# Patient Record
Sex: Female | Born: 1965
Health system: Southern US, Community
[De-identification: ages and names within clinical notes are randomized; demographics above are authoritative.]

## PROBLEM LIST (undated history)

## (undated) DIAGNOSIS — R7303 Prediabetes: Secondary | ICD-10-CM

## (undated) DIAGNOSIS — K219 Gastro-esophageal reflux disease without esophagitis: Secondary | ICD-10-CM

## (undated) DIAGNOSIS — Z8489 Family history of other specified conditions: Secondary | ICD-10-CM

## (undated) DIAGNOSIS — I10 Essential (primary) hypertension: Secondary | ICD-10-CM

## (undated) HISTORY — PX: CHOLECYSTECTOMY: SHX55

## (undated) HISTORY — PX: OVARIAN CYST REMOVAL: SHX89

## (undated) HISTORY — DX: Prediabetes: R73.03

---

## 1996-05-13 HISTORY — PX: CHOLECYSTECTOMY: SHX55

## 2000-10-24 ENCOUNTER — Other Ambulatory Visit: Admission: RE | Admit: 2000-10-24 | Discharge: 2000-10-24 | Payer: Self-pay | Admitting: Internal Medicine

## 2005-07-24 ENCOUNTER — Emergency Department (HOSPITAL_COMMUNITY): Admission: EM | Admit: 2005-07-24 | Discharge: 2005-07-24 | Payer: Self-pay | Admitting: Emergency Medicine

## 2011-01-11 ENCOUNTER — Emergency Department (HOSPITAL_COMMUNITY)
Admission: EM | Admit: 2011-01-11 | Discharge: 2011-01-11 | Disposition: A | Discharge status: Home / Self Care | Payer: No Typology Code available for payment source | Attending: Emergency Medicine | Admitting: Emergency Medicine

## 2011-01-11 ENCOUNTER — Encounter: Payer: Self-pay | Admitting: Emergency Medicine

## 2011-01-11 ENCOUNTER — Emergency Department (HOSPITAL_COMMUNITY): Payer: No Typology Code available for payment source

## 2011-01-11 DIAGNOSIS — Y92410 Unspecified street and highway as the place of occurrence of the external cause: Secondary | ICD-10-CM | POA: Insufficient documentation

## 2011-01-11 DIAGNOSIS — IMO0002 Reserved for concepts with insufficient information to code with codable children: Secondary | ICD-10-CM

## 2011-01-11 MED ORDER — HYDROCODONE-ACETAMINOPHEN 5-325 MG PO TABS: ORAL_TABLET | ORAL | Status: DC

## 2011-01-11 MED ORDER — METHOCARBAMOL 500 MG PO TABS: ORAL_TABLET | ORAL | Status: DC
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
## 2011-01-12 NOTE — ED Provider Notes (Signed)
Medical screening examination/treatment/procedure(s) were performed by non-physician practitioner and as supervising physician I was immediately available for consultation/collaboration.   Benny Lennert, MD 01/12/11 1051

## 2011-01-14 ENCOUNTER — Encounter (HOSPITAL_COMMUNITY): Payer: Self-pay | Admitting: *Deleted

## 2017-09-16 ENCOUNTER — Other Ambulatory Visit (HOSPITAL_COMMUNITY): Payer: Self-pay | Admitting: Family Medicine

## 2017-09-16 DIAGNOSIS — Z1231 Encounter for screening mammogram for malignant neoplasm of breast: Principal | ICD-10-CM

## 2017-09-17 ENCOUNTER — Other Ambulatory Visit (HOSPITAL_COMMUNITY): Payer: Self-pay | Admitting: Family Medicine

## 2017-09-17 DIAGNOSIS — Z1231 Encounter for screening mammogram for malignant neoplasm of breast: Secondary | ICD-10-CM

## 2017-09-19 ENCOUNTER — Encounter: Payer: Self-pay | Admitting: Gastroenterology

## 2017-09-22 ENCOUNTER — Ambulatory Visit (HOSPITAL_COMMUNITY)
Admission: RE | Admit: 2017-09-22 | Discharge: 2017-09-22 | Disposition: A | Discharge status: Home / Self Care | Payer: BLUE CROSS/BLUE SHIELD | Source: Ambulatory Visit | Attending: Family Medicine | Admitting: Family Medicine

## 2017-09-22 ENCOUNTER — Encounter (HOSPITAL_COMMUNITY): Payer: Self-pay | Admitting: Radiology

## 2017-09-22 DIAGNOSIS — Z1231 Encounter for screening mammogram for malignant neoplasm of breast: Secondary | ICD-10-CM | POA: Insufficient documentation

## 2017-10-23 ENCOUNTER — Ambulatory Visit (INDEPENDENT_AMBULATORY_CARE_PROVIDER_SITE_OTHER): Payer: Self-pay

## 2017-10-23 DIAGNOSIS — Z1211 Encounter for screening for malignant neoplasm of colon: Secondary | ICD-10-CM

## 2017-10-23 MED ORDER — NA SULFATE-K SULFATE-MG SULF 17.5-3.13-1.6 GM/177ML PO SOLN: 1.0000 | ORAL | 0 refills | Status: DC

## 2017-10-23 NOTE — Progress Notes (Signed)
Gastroenterology Pre-Procedure Review  Request Date:10/23/17 Requesting Physician: Dr.[facility] (no previous tcs)  PATIENT REVIEW QUESTIONS: The patient responded to the following health history questions as indicated:    Pt stated she had her gallbladder removed 20 years ago and certain foods she eats will cause diarrhea but this only happens with certain foods.   1. Diabetes Melitis: no 2. Joint replacements in the past 12 months: no 3. Major health problems in the past 3 months: no 4. Has an artificial valve or MVP: no 5. Has a defibrillator: no 6. Has been advised in past to take antibiotics in advance of a procedure like teeth cleaning: no 7. Family history of colon cancer: no  8. Alcohol Use: no 9. History of sleep apnea: no  10. History of coronary artery or other vascular stents placed within the last 12 months: no 11. History of any prior anesthesia complications: no    MEDICATIONS & ALLERGIES:    Patient reports the following regarding taking any blood thinners:   Plavix? no Aspirin? no Coumadin? no Brilinta? no Xarelto? no Eliquis? no Pradaxa? no Savaysa? no Effient? no  Patient confirms/reports the following medications:  Current Outpatient Medications  Medication Sig Dispense Refill  . amLODipine (NORVASC) 5 MG tablet daily.  1  . esomeprazole (NEXIUM) 40 MG capsule Take 40 mg by mouth daily at 12 noon.    . hydrochlorothiazide (HYDRODIURIL) 12.5 MG tablet daily.  2   No current facility-administered medications for this visit.     Patient confirms/reports the following allergies:  Allergies  Allergen Reactions  . Codeine Nausea And Vomiting    Abdominal Cramping    No orders of the defined types were placed in this encounter.   AUTHORIZATION INFORMATION Primary Insurance: BCBS  ID #: [id number] Pre-Cert / Josem Kaufmann required: no   SCHEDULE INFORMATION: Procedure has been scheduled as follows:  Date: 12/19/17, Time:  7:30 Location: [facility] Dr.Rourk  This Gastroenterology Pre-Precedure Review Form is being routed to the following provider(s): Walden Field NP

## 2017-10-23 NOTE — Progress Notes (Signed)
Ok to schedule.

## 2017-10-23 NOTE — Patient Instructions (Addendum)
Carrie Evans  1965/09/25 MRN: [id number]     Procedure Date: 11/17/2018 Time to register: 11:15am Place to register: [facility] Procedure Time: 12:15pm Scheduled provider: R. Garfield Cornea, MD      PREPARATION FOR COLONOSCOPY WITH SUPREP BOWEL PREP KIT  Note: Suprep Bowel Prep Kit is a split-dose (2day) regimen. Consumption of BOTH 6-ounce bottles is required for a complete prep.  Please notify us immediately if you are diabetic, take iron supplements, or if you are on Coumadin or any other blood thinners or if any of your health information changes prior to your colonoscopy.                                                                                                                                            2 DAYS BEFORE PROCEDURE:  DATE: 11/15/2018  DAY: Sunday Begin clear liquid diet AFTER your lunch meal. NO SOLID FOODS after this point.  1 DAY BEFORE PROCEDURE:  DATE: 11/16/2018 DAY: Monday Continue clear liquids the entire day - NO SOLID FOOD.   At 6:00pm: Complete steps 1 through 4 below, using ONE (1) 6-ounce bottle, before going to bed. Step 1:  Pour ONE (1) 6-ounce bottle of SUPREP liquid into the mixing container.  Step 2:  Add cool drinking water to the 16 ounce line on the container and mix.  Note: Dilute the solution concentrate as directed prior to use. Step 3:  DRINK ALL the liquid in the container. Step 4:  You MUST drink an additional two (2) or more 16 ounce containers of water over the next one (1) hour.   Continue clear liquids.  DAY OF PROCEDURE:   DATE: 11/17/2018  DAY: Tuesday If you take medications for your heart, blood pressure, or breathing, you may take these medications.   5 hours before your procedure at :7:15am Step 1:  Pour ONE (1) 6-ounce bottle of SUPREP liquid into the mixing container.  Step 2:  Add cool drinking water to the 16 ounce line on the container and mix.  Note: Dilute the solution concentrate as directed prior to  use. Step 3:  DRINK ALL the liquid in the container. Step 4:  You MUST drink an additional two (2) or more 16 ounce containers of water over the next one (1) hour. You MUST complete the final glass of water at least 3 hours before your colonoscopy.   Nothing by mouth past:9:15am  You may take your morning medications with sip of water unless we have instructed otherwise.    Please see below for Dietary Information.  CLEAR LIQUIDS INCLUDE:  Water Jello (NOT red in color)   Ice Popsicles (NOT red in color)   Tea (sugar ok, no milk/cream) Powdered fruit flavored drinks  Coffee (sugar ok, no milk/cream) Gatorade/ Lemonade/ Kool-Aid  (NOT red in color)   Juice: apple, white grape, white cranberry  Soft drinks  Clear bullion, consomme, broth (fat free beef/chicken/vegetable)  Carbonated beverages (any kind)  Strained chicken noodle soup Hard Candy   Remember: Clear liquids are liquids that will allow you to see your fingers on the other side of a clear glass. Be sure liquids are NOT red in color, and not cloudy, but CLEAR.  DO NOT EAT OR DRINK ANY OF THE FOLLOWING:  Dairy products of any kind   Cranberry juice Tomato juice / V8 juice   Grapefruit juice Orange juice     Red grape juice  Do not eat any solid foods, including such foods as: cereal, oatmeal, yogurt, fruits, vegetables, creamed soups, eggs, bread, crackers, pureed foods in a blender, etc.   HELPFUL HINTS FOR DRINKING PREP SOLUTION:   Make sure prep is extremely cold. Mix and refrigerate the the morning of the prep. You may also put in the freezer.   You may try mixing some Crystal Light or Country Time Lemonade if you prefer. Mix in small amounts; add more if necessary.  Try drinking through a straw  Rinse mouth with water or a mouthwash between glasses, to remove after-taste.  Try sipping on a cold beverage /ice/ popsicles between glasses of prep.  Place a piece of sugar-free hard candy in mouth between glasses.  If  you become nauseated, try consuming smaller amounts, or stretch out the time between glasses. Stop for 30-60 minutes, then slowly start back drinking.     OTHER INSTRUCTIONS  You will need a responsible adult at least 51 years of age to accompany you and drive you home. This person must remain in the waiting room during your procedure. The hospital will cancel your procedure if you do not have a responsible adult with you.   1. Wear loose fitting clothing that is easily removed. 2. Leave jewelry and other valuables at home.  3. Remove all body piercing jewelry and leave at home. 4. Total time from sign-in until discharge is approximately 2-3 hours. 5. You should go home directly after your procedure and rest. You can resume normal activities the day after your procedure. 6. The day of your procedure you should not:  Drive  Make legal decisions  Operate machinery  Drink alcohol  Return to work   You may call the office (Dept: [phone]) before 5:00pm, or page the doctor on call [phone]) after 5:00pm, for further instructions, if necessary.   Insurance Information YOU WILL NEED TO CHECK WITH YOUR INSURANCE COMPANY FOR THE BENEFITS OF COVERAGE YOU HAVE FOR THIS PROCEDURE.  UNFORTUNATELY, NOT ALL INSURANCE COMPANIES HAVE BENEFITS TO COVER ALL OR PART OF THESE TYPES OF PROCEDURES.  IT IS YOUR RESPONSIBILITY TO CHECK YOUR BENEFITS, HOWEVER, WE WILL BE GLAD TO ASSIST YOU WITH ANY CODES YOUR INSURANCE COMPANY MAY NEED.    PLEASE NOTE THAT MOST INSURANCE COMPANIES WILL NOT COVER A SCREENING COLONOSCOPY FOR PEOPLE UNDER THE AGE OF 50  IF YOU HAVE BCBS INSURANCE, YOU MAY HAVE BENEFITS FOR A SCREENING COLONOSCOPY BUT IF POLYPS ARE FOUND THE DIAGNOSIS WILL CHANGE AND THEN YOU MAY HAVE A DEDUCTIBLE THAT WILL NEED TO BE MET. SO PLEASE MAKE SURE YOU CHECK YOUR BENEFITS FOR A SCREENING COLONOSCOPY AS WELL AS A DIAGNOSTIC COLONOSCOPY.

## 2017-10-28 ENCOUNTER — Telehealth: Payer: Self-pay | Admitting: Internal Medicine

## 2017-10-28 NOTE — Telephone Encounter (Signed)
[phone] patient called and said the copay for her prep was very high and she was told we could call her another one into her pharmacy

## 2017-10-29 MED ORDER — PEG 3350-KCL-NA BICARB-NACL 420 G PO SOLR: 4000.0000 mL | ORAL | 0 refills | Status: DC

## 2017-10-29 NOTE — Telephone Encounter (Signed)
Sent in new rx for trilyte and new instructions have been mailed to the pt. Tried to call to inform pt, NA and no voicemail. Please let her know if she calls back.

## 2017-10-29 NOTE — Progress Notes (Signed)
See phone note about prep, copay too high, sent in rx for trilyte and new instructions mailed to the pt.

## 2017-10-29 NOTE — Addendum Note (Signed)
Addended by: Claudina Lick on: 10/29/2017 01:38 PM   Modules accepted: Orders

## 2017-10-29 NOTE — Telephone Encounter (Signed)
Patient made aware.

## 2017-12-08 ENCOUNTER — Telehealth: Payer: Self-pay | Admitting: Internal Medicine

## 2017-12-08 NOTE — Progress Notes (Signed)
Pt called and changed date of procedure. New instructions have been mailed to the pt with the correct dates. See phone note.

## 2017-12-08 NOTE — Telephone Encounter (Signed)
Carrie Evans, SHE WANTS TO RESCHEDULE HER PROCEDURE TO THE NEXT AVAILABLE Friday

## 2017-12-08 NOTE — Telephone Encounter (Signed)
Called pt, changed date to 01/09/18. New instructions mailed to the pt with correct dates. Called [place] and LM to change dates of procedure.

## 2017-12-09 NOTE — Telephone Encounter (Signed)
Hoyle Sauer called- the spot on 01/09/18 was not available, called and informed pt, moved to 01/30/18 and instructions were mailed to the pt. Was able to retreive previous instructions before they went out in the mail.

## 2018-01-22 ENCOUNTER — Telehealth: Payer: Self-pay | Admitting: Internal Medicine

## 2018-01-22 NOTE — Telephone Encounter (Signed)
Pt wants to reschedule her colonoscopy that's scheduled with [facility] on 9/20. Please call her at [phone]

## 2018-01-22 NOTE — Telephone Encounter (Signed)
Tried to call pt- NA-no voicemail.  

## 2018-01-26 NOTE — Telephone Encounter (Signed)
Called [facility] and LM for them to do the suprep and if there were any problems to call me. Pt stated she would pay the $90 copay for it.

## 2018-01-26 NOTE — Telephone Encounter (Signed)
Called pt- moved her to 03/13/18, I will mail new instructions to her and she does want the suprep and has asked me to send that in for her again. I called and LM for Hoyle Sauer and I have moved her on our schedule.

## 2018-01-27 NOTE — Telephone Encounter (Signed)
New instructions done and mailed to the pt.

## 2018-01-27 NOTE — Progress Notes (Signed)
Pt called- changed date of tcs and prep. See phone note. New instructions mailed to the pt.

## 2018-03-11 ENCOUNTER — Telehealth: Payer: Self-pay

## 2018-03-11 NOTE — Telephone Encounter (Signed)
Called pt, explained to her that if we reschedule her, it will be a January appt. Pt stated she understood. She does not have anyone who can bring her on 03/13/18. I gave her dates in January and she is going to check with family members to see if someone can bring her. Pt will call me back this week. I have taken her off the schedule and called endo and asked them to take her off of Friday and I would call back as soon as the pt calls me back for them to put her back on the schedule.

## 2018-03-11 NOTE — Telephone Encounter (Signed)
[phone] please call patient, she needs to reschedule her tcs

## 2018-03-16 ENCOUNTER — Telehealth: Payer: Self-pay | Admitting: Gastroenterology

## 2018-03-16 NOTE — Telephone Encounter (Signed)
[phone] PLEASE CALL PATIENT, SHE NEEDS TO RESCHEDULE HER TCS

## 2018-03-16 NOTE — Progress Notes (Signed)
See phone note, pt changed date of procedure. New instructions done and mailed to the pt.

## 2018-03-16 NOTE — Telephone Encounter (Signed)
Scheduled pt, mailed new instructions, called and lm for [facility].

## 2018-03-17 NOTE — Progress Notes (Signed)
Carrie Evans called- the date for this tcs is an 8:30 start day- I have gotten the pts instructions from the mail and have changed the times on these and mailed them to the pt.

## 2018-03-25 NOTE — Progress Notes (Signed)
Due to a change in [facility] schedule, we had to reschedule the pt, she called back and said she checked with work and could not come on 06/10/18. We have rescheduled her to 06/24/18 and mailed new instructions. Endo is aware.

## 2018-06-23 ENCOUNTER — Telehealth: Payer: Self-pay | Admitting: Internal Medicine

## 2018-06-23 NOTE — Telephone Encounter (Signed)
[phone] PATIENT NEEDS TO RESCHEDULE PROCEDURE SHE IS SCHEDULED FOR Wednesday.  NO TRANSPORTATION

## 2018-06-23 NOTE — Telephone Encounter (Signed)
Called endo and LM that pt wants to reschedule.

## 2018-06-23 NOTE — Telephone Encounter (Signed)
Tried to call pt- NA-voicemail has not been set up yet. .  

## 2018-06-24 ENCOUNTER — Ambulatory Visit (HOSPITAL_COMMUNITY)
Admission: RE | Admit: 2018-06-24 | Payer: BLUE CROSS/BLUE SHIELD | Source: Home / Self Care | Admitting: Internal Medicine

## 2018-06-24 ENCOUNTER — Encounter (HOSPITAL_COMMUNITY): Admission: RE | Payer: Self-pay | Source: Home / Self Care

## 2018-06-24 SURGERY — COLONOSCOPY
Anesthesia: Moderate Sedation

## 2018-06-26 NOTE — Telephone Encounter (Signed)
Tried to call pt- NA-voicemail has not been set up yet. .  

## 2018-07-06 NOTE — Telephone Encounter (Signed)
Mailed letter to the pt to call the office.

## 2018-07-14 NOTE — Progress Notes (Signed)
See phone note, pt rescheduled again. No information has changed since triage. Orders cancelled the last time and had to put in new orders. New instructions mailed to the pt.

## 2018-07-14 NOTE — Addendum Note (Signed)
Addended by: Claudina Lick on: 07/14/2018 04:39 PM   Modules accepted: Orders, SmartSet

## 2018-09-04 ENCOUNTER — Telehealth: Payer: Self-pay | Admitting: *Deleted

## 2018-09-04 NOTE — Progress Notes (Signed)
Pt's procedure was re-scheduled due to COVID 19.  Pt aware that we are mailing out new instructions.  Kim in Endo notified.

## 2018-09-04 NOTE — Telephone Encounter (Signed)
Called pt to inform her that her procedure would need to be re-scheduled due to COVID 19.  Left message on her voice mail for her to call us back.

## 2018-09-17 DIAGNOSIS — K219 Gastro-esophageal reflux disease without esophagitis: Secondary | ICD-10-CM | POA: Diagnosis not present

## 2018-10-29 ENCOUNTER — Other Ambulatory Visit (HOSPITAL_COMMUNITY): Payer: Self-pay | Admitting: Family Medicine

## 2018-10-29 DIAGNOSIS — Z1231 Encounter for screening mammogram for malignant neoplasm of breast: Secondary | ICD-10-CM

## 2018-11-02 ENCOUNTER — Other Ambulatory Visit: Payer: Self-pay

## 2018-11-02 ENCOUNTER — Ambulatory Visit (HOSPITAL_COMMUNITY)
Admission: RE | Admit: 2018-11-02 | Discharge: 2018-11-02 | Disposition: A | Discharge status: Home / Self Care | Payer: BC Managed Care – PPO | Source: Ambulatory Visit | Attending: Family Medicine | Admitting: Family Medicine

## 2018-11-02 DIAGNOSIS — Z1231 Encounter for screening mammogram for malignant neoplasm of breast: Secondary | ICD-10-CM | POA: Diagnosis not present

## 2018-11-05 ENCOUNTER — Telehealth: Payer: Self-pay | Admitting: *Deleted

## 2018-11-05 NOTE — Telephone Encounter (Signed)
Pt is scheduled for COVID 19 screening on 11/12/2018.  Pt is aware to remain in quarantine once testing is done.  Pt voiced understanding.

## 2018-11-12 ENCOUNTER — Other Ambulatory Visit (HOSPITAL_COMMUNITY)
Admission: RE | Admit: 2018-11-12 | Discharge: 2018-11-12 | Disposition: A | Discharge status: Home / Self Care | Payer: BC Managed Care – PPO | Source: Ambulatory Visit | Attending: Internal Medicine | Admitting: Internal Medicine

## 2018-11-16 ENCOUNTER — Telehealth: Payer: Self-pay | Admitting: Internal Medicine

## 2018-11-16 NOTE — Telephone Encounter (Signed)
Endo scheduler informed to cancel procedure per pt request.

## 2018-11-16 NOTE — Telephone Encounter (Signed)
See other phone note, pt canceled procedure.

## 2018-11-16 NOTE — Telephone Encounter (Signed)
Pt wants to cancel her procedure with [facility] tomorrow because she isn't comfortable going into hospital and covid.

## 2018-11-16 NOTE — Telephone Encounter (Addendum)
Doris received a call from [place] in endo. Patient did not have her COVID-19 testing done.  Called pt, VM not set up. She is on [facility] schedule for tomorrow. If no testing done by 12pm today she will be cancelled.

## 2018-11-17 ENCOUNTER — Ambulatory Visit (HOSPITAL_COMMUNITY)
Admission: RE | Admit: 2018-11-17 | Payer: BC Managed Care – PPO | Source: Home / Self Care | Admitting: Internal Medicine

## 2018-11-17 ENCOUNTER — Encounter (HOSPITAL_COMMUNITY): Admission: RE | Payer: Self-pay | Source: Home / Self Care

## 2018-11-17 SURGERY — COLONOSCOPY
Anesthesia: Moderate Sedation

## 2018-11-23 NOTE — Telephone Encounter (Signed)
Noted  

## 2019-01-15 DIAGNOSIS — Z0000 Encounter for general adult medical examination without abnormal findings: Secondary | ICD-10-CM | POA: Diagnosis not present

## 2019-01-15 DIAGNOSIS — E782 Mixed hyperlipidemia: Secondary | ICD-10-CM | POA: Diagnosis not present

## 2019-01-15 DIAGNOSIS — J302 Other seasonal allergic rhinitis: Secondary | ICD-10-CM | POA: Diagnosis not present

## 2019-01-15 DIAGNOSIS — Z131 Encounter for screening for diabetes mellitus: Secondary | ICD-10-CM | POA: Diagnosis not present

## 2019-01-15 DIAGNOSIS — I10 Essential (primary) hypertension: Secondary | ICD-10-CM | POA: Diagnosis not present

## 2019-05-14 HISTORY — PX: BREAST BIOPSY: SHX20

## 2019-09-15 DIAGNOSIS — M79674 Pain in right toe(s): Secondary | ICD-10-CM | POA: Diagnosis not present

## 2019-11-16 ENCOUNTER — Other Ambulatory Visit: Payer: Self-pay

## 2019-11-16 ENCOUNTER — Ambulatory Visit
Admission: RE | Admit: 2019-11-16 | Discharge: 2019-11-16 | Disposition: A | Discharge status: Home / Self Care | Payer: BC Managed Care – PPO | Source: Ambulatory Visit

## 2019-11-16 VITALS — BP 157/83 | HR 76 | Temp 98.4°F | Resp 15 | Wt 228.0 lb

## 2019-11-16 DIAGNOSIS — R140 Abdominal distension (gaseous): Secondary | ICD-10-CM

## 2019-11-16 DIAGNOSIS — K219 Gastro-esophageal reflux disease without esophagitis: Secondary | ICD-10-CM

## 2019-11-16 HISTORY — DX: Essential (primary) hypertension: I10

## 2019-11-16 MED ORDER — SIMETHICONE 125 MG PO CHEW: CHEWABLE_TABLET | ORAL | 0 refills | Status: DC

## 2019-11-16 MED ORDER — ALUM & MAG HYDROXIDE-SIMETH 200-200-20 MG/5ML PO SUSP
30.0000 mL | Freq: Once | ORAL | Status: AC
  Administered 2019-11-16: 30 mL via ORAL

## 2019-11-16 NOTE — Discharge Instructions (Addendum)
GI cocktail given in office Continue with protonix Mylicon prescribed.  Use as directed for gas and bloating Avoid eating 2-3 hours before bed Elevate head of bed.  Avoid chocolate, caffeine, alcohol, onion, and mint prior to bed.  This relaxes the bottom part of your esophagus and can make your symptoms worse.  Follow up with PCP or GI if symptoms persists If you experience new or worsening symptoms return or go to ER such as fever, chills, nausea, vomiting, diarrhea, bloody or dark tarry stools, constipation, urinary symptoms, worsening abdominal discomfort, symptoms that do not improve with medications, inability to keep fluids down, etc..Marland Kitchen

## 2019-11-16 NOTE — ED Triage Notes (Signed)
This morning pt was having a lot belching and gas.  Pt does have acid reflux.  After this episode she vomited twice.

## 2019-11-16 NOTE — ED Provider Notes (Signed)
[place]   [id number] 11/16/19 Arrival Time: 7829  CC: acid reflux  SUBJECTIVE:  Carrie Evans is a 54 y.o. female who presents with complaint of acid reflux with two episodes of vomiting that occurred this morning.  Admits to eating spicy foods this past week and snacking late last night.  Denies abdominal pain.   Has tried protonix without relief.  Worse with eating late at night.  Reports similar symptoms in the past with acid reflux.  Last BM today.  Also reports belching, gas, and bloating.      Denies fever, chills, chest pain, SOB, diarrhea, constipation, hematochezia, melena, dysuria, difficulty urinating, increased frequency or urgency, flank pain, loss of bowel or bladder function.  ROS: As per HPI.  All other pertinent ROS negative.     Past Medical History:  Diagnosis Date  . Hypertension    Past Surgical History:  Procedure Laterality Date  . CHOLECYSTECTOMY    . OVARIAN CYST REMOVAL     Allergies  Allergen Reactions  . Codeine Nausea And Vomiting    Abdominal Cramping   No current facility-administered medications on file prior to encounter.   Current Outpatient Medications on File Prior to Encounter  Medication Sig Dispense Refill  . Omega-3 Fatty Acids (FISH OIL) 1000 MG CAPS Take by mouth.    . pantoprazole (PROTONIX) 40 MG tablet Take 40 mg by mouth daily.    Marland Kitchen amLODipine (NORVASC) 5 MG tablet daily.  1  . hydrochlorothiazide (HYDRODIURIL) 12.5 MG tablet daily.  2  . [DISCONTINUED] esomeprazole (NEXIUM) 40 MG capsule Take 40 mg by mouth daily at 12 noon.     Social History   Socioeconomic History  . Marital status: Married    Spouse name: Not on file  . Number of children: Not on file  . Years of education: Not on file  . Highest education level: Not on file  Occupational History  . Not on file  Tobacco Use  . Smoking status: Never Smoker  . Smokeless tobacco: Never Used  Vaping Use  . Vaping Use: Never used  Substance and Sexual  Activity  . Alcohol use: No  . Drug use: No  . Sexual activity: Not on file  Other Topics Concern  . Not on file  Social History Narrative  . Not on file   Social Determinants of Health   Financial Resource Strain:   . Difficulty of Paying Living Expenses:   Food Insecurity:   . Worried About Charity fundraiser in the Last Year:   . Arboriculturist in the Last Year:   Transportation Needs:   . Film/video editor (Medical):   Marland Kitchen Lack of Transportation (Non-Medical):   Physical Activity:   . Days of Exercise per Week:   . Minutes of Exercise per Session:   Stress:   . Feeling of Stress :   Social Connections:   . Frequency of Communication with Friends and Family:   . Frequency of Social Gatherings with Friends and Family:   . Attends Religious Services:   . Active Member of Clubs or Organizations:   . Attends Archivist Meetings:   Marland Kitchen Marital Status:   Intimate Partner Violence:   . Fear of Current or Ex-Partner:   . Emotionally Abused:   Marland Kitchen Physically Abused:   . Sexually Abused:    Family History  Problem Relation Age of Onset  . Breast cancer Cousin        Mother's  niece     OBJECTIVE:  Vitals:   11/16/19 1601  BP: (!) 157/83  Pulse: 76  Resp: 15  Temp: 98.4 F (36.9 C)  TempSrc: Oral  SpO2: 97%  Weight: 228 lb (103.4 kg)    General appearance: Alert; NAD HEENT: NCAT.  Oropharynx clear.  Lungs: clear to auscultation bilaterally without adventitious breath sounds Heart: regular rate and rhythm.  Abdomen: soft, non-distended; normal active bowel sounds; non-tender to light and deep palpation; no guarding Extremities: no edema; symmetrical with no gross deformities Skin: warm and dry Neurologic: normal gait Psychological: alert and cooperative; normal mood and affect   ASSESSMENT & PLAN:  1. Gastroesophageal reflux disease, unspecified whether esophagitis present   2. Bloating     Meds ordered this encounter  Medications  . alum &  mag hydroxide-simeth (MAALOX/MYLANTA) 200-200-20 MG/5ML suspension 30 mL  . simethicone (MYLICON) 875 MG chewable tablet    Sig: Thoroughly chew 125 to 250 mg orally as needed after meals and at bedtime; MAX 750 mg/day    Dispense:  20 tablet    Refill:  0    Order Specific Question:   Supervising Provider    Answer:   Raylene Everts [[id number]]   GI cocktail given in office Continue with protonix Mylicon prescribed.  Use as directed for gas and bloating Avoid eating 2-3 hours before bed Elevate head of bed.  Avoid chocolate, caffeine, alcohol, onion, and mint prior to bed.  This relaxes the bottom part of your esophagus and can make your symptoms worse.  Follow up with PCP or GI if symptoms persists If you experience new or worsening symptoms return or go to ER such as fever, chills, nausea, vomiting, diarrhea, bloody or dark tarry stools, constipation, urinary symptoms, worsening abdominal discomfort, symptoms that do not improve with medications, inability to keep fluids down, etc...  Reviewed expectations re: course of current medical issues. Questions answered. Outlined signs and symptoms indicating need for more acute intervention. Patient verbalized understanding. After Visit Summary given.   Lestine Box, PA-C 11/16/19 1625

## 2019-12-02 ENCOUNTER — Encounter: Payer: Self-pay | Admitting: *Deleted

## 2019-12-08 DIAGNOSIS — H6120 Impacted cerumen, unspecified ear: Secondary | ICD-10-CM | POA: Diagnosis not present

## 2019-12-08 DIAGNOSIS — Z1159 Encounter for screening for other viral diseases: Secondary | ICD-10-CM | POA: Diagnosis not present

## 2019-12-08 DIAGNOSIS — E782 Mixed hyperlipidemia: Secondary | ICD-10-CM | POA: Diagnosis not present

## 2019-12-08 DIAGNOSIS — R7303 Prediabetes: Secondary | ICD-10-CM | POA: Diagnosis not present

## 2019-12-08 DIAGNOSIS — I10 Essential (primary) hypertension: Secondary | ICD-10-CM | POA: Diagnosis not present

## 2019-12-08 DIAGNOSIS — R232 Flushing: Secondary | ICD-10-CM | POA: Diagnosis not present

## 2019-12-08 DIAGNOSIS — Z114 Encounter for screening for human immunodeficiency virus [HIV]: Secondary | ICD-10-CM | POA: Diagnosis not present

## 2019-12-08 DIAGNOSIS — E559 Vitamin D deficiency, unspecified: Secondary | ICD-10-CM | POA: Diagnosis not present

## 2019-12-08 DIAGNOSIS — J302 Other seasonal allergic rhinitis: Secondary | ICD-10-CM | POA: Diagnosis not present

## 2019-12-28 ENCOUNTER — Other Ambulatory Visit (HOSPITAL_COMMUNITY): Payer: Self-pay | Admitting: Family Medicine

## 2019-12-28 DIAGNOSIS — Z1231 Encounter for screening mammogram for malignant neoplasm of breast: Secondary | ICD-10-CM

## 2020-01-04 ENCOUNTER — Ambulatory Visit: Payer: Managed Care, Other (non HMO)

## 2020-01-05 ENCOUNTER — Ambulatory Visit: Payer: Managed Care, Other (non HMO)

## 2020-02-14 ENCOUNTER — Ambulatory Visit (HOSPITAL_COMMUNITY)
Admission: RE | Admit: 2020-02-14 | Discharge: 2020-02-14 | Disposition: A | Discharge status: Home / Self Care | Payer: BC Managed Care – PPO | Source: Ambulatory Visit | Attending: Family Medicine | Admitting: Family Medicine

## 2020-02-14 ENCOUNTER — Other Ambulatory Visit: Payer: Self-pay

## 2020-02-14 DIAGNOSIS — Z1231 Encounter for screening mammogram for malignant neoplasm of breast: Secondary | ICD-10-CM | POA: Insufficient documentation

## 2020-02-15 ENCOUNTER — Ambulatory Visit (INDEPENDENT_AMBULATORY_CARE_PROVIDER_SITE_OTHER): Payer: Self-pay | Admitting: *Deleted

## 2020-02-15 VITALS — Ht 66.5 in | Wt 232.4 lb

## 2020-02-15 DIAGNOSIS — Z1211 Encounter for screening for malignant neoplasm of colon: Secondary | ICD-10-CM

## 2020-02-15 NOTE — Progress Notes (Signed)
Gastroenterology Pre-Procedure Review  Request Date: 02/15/2020 Requesting Physician: Roanna Epley Zhou-Talbert @ [facility], no previous TCS  PATIENT REVIEW QUESTIONS: The patient responded to the following health history questions as indicated:    1. Diabetes Melitis: no 2. Joint replacements in the past 12 months: no 3. Major health problems in the past 3 months: no 4. Has an artificial valve or MVP: no 5. Has a defibrillator: no 6. Has been advised in past to take antibiotics in advance of a procedure like teeth cleaning: no 7. Family history of colon cancer: yes, cousin: age 9's 8. Alcohol Use: no 9. Illicit drug Use: no 10. History of sleep apnea: no  11. History of coronary artery or other vascular stents placed within the last 12 months: no 12. History of any prior anesthesia complications: no 13. Body mass index is 36.95 kg/m.    MEDICATIONS & ALLERGIES:    Patient reports the following regarding taking any blood thinners:   Plavix? no Aspirin? no Coumadin? no Brilinta? no Xarelto? no Eliquis? no Pradaxa? no Savaysa? no Effient? no  Patient confirms/reports the following medications:  Current Outpatient Medications  Medication Sig Dispense Refill  . amLODipine (NORVASC) 5 MG tablet daily.  1  . cetirizine (ZYRTEC) 10 MG tablet Take 10 mg by mouth daily.    . cholecalciferol (VITAMIN D3) 25 MCG (1000 UNIT) tablet Take 1,000 Units by mouth daily.    . hydrochlorothiazide (HYDRODIURIL) 12.5 MG tablet daily.  2  . Omega-3 Fatty Acids (FISH OIL) 1000 MG CAPS Take by mouth daily.     . pantoprazole (PROTONIX) 40 MG tablet Take 40 mg by mouth daily.    Marland Kitchen POTASSIUM PO Take by mouth daily.    . simethicone (MYLICON) 889 MG chewable tablet Thoroughly chew 125 to 250 mg orally as needed after meals and at bedtime; MAX 750 mg/day 20 tablet 0  . azelastine (ASTELIN) 0.1 % nasal spray Place 1 spray into both nostrils 2 (two) times daily.     No current  facility-administered medications for this visit.    Patient confirms/reports the following allergies:  Allergies  Allergen Reactions  . Codeine Nausea And Vomiting    Abdominal Cramping    No orders of the defined types were placed in this encounter.   AUTHORIZATION INFORMATION Primary Insurance: [place],  [place] #: ,[id number]  Group #: [id number] Pre-Cert / Josem Kaufmann required: No, not required  Secondary Insurance: [place],  [place] # [id number] ,  Group #: [id number] Pre-Cert / Josem Kaufmann required: No, not required  SCHEDULE INFORMATION: Procedure has been scheduled as follows:  Date: 04/03/2020, Time: 8:15 Location: [facility] with Dr. Abbey Chatters  This Gastroenterology Pre-Precedure Review Form is being routed to the following provider(s): Neil Crouch, PA

## 2020-02-15 NOTE — Patient Instructions (Signed)
[place]   Please notify us immediately if you are diabetic, take iron supplements, or if you are on coumadin or any blood thinners.   Patient Name: Carrie Evans Date of procedure: 04/03/2020  Time to register at [facility] Stay: 6:45 am Provider: Dr. Abbey Chatters   Purchase: MIRALAX 238 gram bottle, 1 FLEET ENEMA, 1 box of DULCOLAX (All over the counter medications)    04/01/2020- 2 Days prior to procedure: START CLEAR LIQUID DIET AFTER YOUR LUNCH MEAL--NO SOLID FOODS!   04/02/2020- 1 Day prior to procedure:   CLEAR LIQUIDS ALL DAY--NO SOLID FOODS!   Diabetic medication adjustments for today:    At 10:00 AM, take 2 DULCOLAX 45m tablets   At 12:00 PM, Mix 5 teaspoons of Miralax in any 4-6 ounces of CLEAR LIQUIDS (Gatorade) every hour for 5 hours until passing clear, watery stools. Be sure to drink 4 ounces of clear liquid 30 minutes after each dose of Miralax.   At 3:00 PM, take 2 Dulcolax 565mtablets   If stools are not clear & watery by 6:00 PM, take 5 teaspoons of Miralax every 30 minutes until stools are clear (no color)   You must have a complete prep to ensure the most effective cleaning.   CONTINUE CLEAR LIQUIDS ONLY UNTIL MIDNIGHT. Make a conscious effort to drink as much as you can before, during & after the preparation.    NOTHING TO EAT OR DRINK AFTER MIDNIGHT except for your heart, blood pressure & breathing medications. You may take them with a sip of clear liquids.    04/03/2020- Day of Procedure  Give yourself one Fleet enema about 1 hour prior to leaving for the hospital.   Diabetic medication adjustments for today:   You may take TYLENOL products. Please continue your regular medications unless we have instructed you otherwise.    Please note, on the day of your procedure you MUST be accompanied by an adult who is willing to assume responsibility for you at time of discharge. If you do not have such person with you, your  procedure will have to be rescheduled.                                                             Please leave ALL jewelry at home prior to coming to the hospital for your procedure.   *It is your responsibility to check with your insurance company for the benefits of coverage you have for this procedure. Unfortunately, not all insurance companies have benefits to cover all or part of these types of procedures. It is your responsibility to check your benefits, however we will be glad to assist you with any codes your insurance company may need.   Please note that most insurance companies will not cover a screening colonoscopy for people under the age of 5067For example, with some insurance companies you may have benefits for a screening colonoscopy, but if polyps are found the diagnosis will change and then you may have a deductible that will need to be met. Please make sure you check your benefits for screening colonoscopy as well as a diagnostic colonoscopy.    CLEAR LIQUIDS: (NO RED)  Jello Apple Juice White Grape Juice Water  Banana popsicles Kool-Aid Coffee(No cream or milk)  Tea (No cream or milk) Soft drinks Broth (fat free beef/chicken/vegetable)   Clear liquids allow you to see your fingers on the other side of the glass. Be sure they are NOT RED in color, cloudy, but CLEAR.   Do Not Eat:  Dairy products of any kind Cranberry juice  Tomato or V8 Juice Orange Juice  Grapefruit Juice Red Grape Juice  Solid foods like cereal, oatmeal, yogurt, fruits, vegetables, creamed soups, eggs, bread, etc   HELPFUL HINTS TO MAKE DRINKING EASIER:  -Make sure prep is extremely COLD. Refrigerate the night before. You may also put in freezer.  -You may try mixing Crystal Light or Country Time Lemonade if you prefer. MIx in small amounts. Add more if necessary.  -Trying drinking through a straw.  -Rinse mouth with water or mouthwash  between glasses to remove aftertaste.  -Try sipping on a cold beverage/ice popsicles between glasses of prep.  -Place a piece of sugar-free hard candy in mouth between glasses.  -If you become nauseated, try consuming smaller amounts or stretch out the time between glasses. Stop for 30 minutes to an hour & slowly start back drinking.   Call our office with any questions or concerns at [phone].   Thank You

## 2020-02-16 NOTE — Progress Notes (Signed)
Ok to schedule.  ASA II 

## 2020-02-17 ENCOUNTER — Other Ambulatory Visit (HOSPITAL_COMMUNITY): Payer: Self-pay | Admitting: Family Medicine

## 2020-02-18 ENCOUNTER — Other Ambulatory Visit (HOSPITAL_COMMUNITY): Payer: Self-pay | Admitting: Family Medicine

## 2020-02-18 DIAGNOSIS — R928 Other abnormal and inconclusive findings on diagnostic imaging of breast: Secondary | ICD-10-CM

## 2020-03-17 ENCOUNTER — Ambulatory Visit (HOSPITAL_COMMUNITY): Payer: BC Managed Care – PPO

## 2020-03-17 ENCOUNTER — Inpatient Hospital Stay (HOSPITAL_COMMUNITY): Admission: RE | Admit: 2020-03-17 | Payer: BC Managed Care – PPO | Source: Ambulatory Visit

## 2020-03-21 ENCOUNTER — Ambulatory Visit (HOSPITAL_COMMUNITY)
Admission: RE | Admit: 2020-03-21 | Discharge: 2020-03-21 | Disposition: A | Discharge status: Home / Self Care | Payer: BC Managed Care – PPO | Source: Ambulatory Visit | Attending: Family Medicine | Admitting: Family Medicine

## 2020-03-21 ENCOUNTER — Other Ambulatory Visit: Payer: Self-pay

## 2020-03-21 ENCOUNTER — Encounter (HOSPITAL_COMMUNITY): Payer: BC Managed Care – PPO

## 2020-03-21 ENCOUNTER — Other Ambulatory Visit (HOSPITAL_COMMUNITY): Payer: BC Managed Care – PPO

## 2020-03-21 DIAGNOSIS — R922 Inconclusive mammogram: Secondary | ICD-10-CM | POA: Diagnosis not present

## 2020-03-21 DIAGNOSIS — R928 Other abnormal and inconclusive findings on diagnostic imaging of breast: Secondary | ICD-10-CM | POA: Diagnosis not present

## 2020-03-23 ENCOUNTER — Other Ambulatory Visit: Payer: Self-pay | Admitting: Family Medicine

## 2020-03-23 DIAGNOSIS — R928 Other abnormal and inconclusive findings on diagnostic imaging of breast: Secondary | ICD-10-CM

## 2020-03-31 ENCOUNTER — Other Ambulatory Visit: Payer: Self-pay

## 2020-03-31 ENCOUNTER — Other Ambulatory Visit (HOSPITAL_COMMUNITY)
Admission: RE | Admit: 2020-03-31 | Discharge: 2020-03-31 | Disposition: A | Discharge status: Home / Self Care | Payer: BC Managed Care – PPO | Source: Ambulatory Visit | Attending: Internal Medicine | Admitting: Internal Medicine

## 2020-03-31 DIAGNOSIS — K648 Other hemorrhoids: Secondary | ICD-10-CM | POA: Diagnosis not present

## 2020-03-31 DIAGNOSIS — Z79899 Other long term (current) drug therapy: Secondary | ICD-10-CM | POA: Diagnosis not present

## 2020-03-31 DIAGNOSIS — D122 Benign neoplasm of ascending colon: Secondary | ICD-10-CM | POA: Diagnosis not present

## 2020-03-31 DIAGNOSIS — Z885 Allergy status to narcotic agent status: Secondary | ICD-10-CM | POA: Diagnosis not present

## 2020-03-31 DIAGNOSIS — Z01812 Encounter for preprocedural laboratory examination: Secondary | ICD-10-CM | POA: Insufficient documentation

## 2020-03-31 DIAGNOSIS — Z20822 Contact with and (suspected) exposure to covid-19: Secondary | ICD-10-CM | POA: Insufficient documentation

## 2020-03-31 DIAGNOSIS — Z1211 Encounter for screening for malignant neoplasm of colon: Secondary | ICD-10-CM | POA: Diagnosis not present

## 2020-03-31 LAB — BASIC METABOLIC PANEL
Anion gap: 8 (ref 5–15)
BUN: 16 mg/dL (ref 6–20)
CO2: 28 mmol/L (ref 22–32)
Calcium: 9.1 mg/dL (ref 8.9–10.3)
Chloride: 103 mmol/L (ref 98–111)
Creatinine, Ser: 0.79 mg/dL (ref 0.44–1.00)
GFR, Estimated: >60 mL/min (ref >60)
Glucose, Bld: 83 mg/dL (ref 70–99)
Potassium: 3.9 mmol/L (ref 3.5–5.1)
Sodium: 139 mmol/L (ref 135–145)

## 2020-04-01 LAB — SARS CORONAVIRUS 2 (TAT 6-24 HRS): SARS Coronavirus 2: NEGATIVE

## 2020-04-03 ENCOUNTER — Other Ambulatory Visit: Payer: Self-pay

## 2020-04-03 ENCOUNTER — Ambulatory Visit (HOSPITAL_COMMUNITY)
Admission: RE | Admit: 2020-04-03 | Discharge: 2020-04-03 | Disposition: A | Discharge status: Home / Self Care | Payer: BC Managed Care – PPO | Attending: Internal Medicine | Admitting: Internal Medicine

## 2020-04-03 ENCOUNTER — Ambulatory Visit (HOSPITAL_COMMUNITY): Payer: BC Managed Care – PPO | Admitting: Anesthesiology

## 2020-04-03 ENCOUNTER — Encounter (HOSPITAL_COMMUNITY): Payer: Self-pay

## 2020-04-03 ENCOUNTER — Encounter (HOSPITAL_COMMUNITY)
Admission: RE | Disposition: A | Discharge status: Home / Self Care | Payer: Self-pay | Source: Home / Self Care | Attending: Internal Medicine

## 2020-04-03 DIAGNOSIS — Z1211 Encounter for screening for malignant neoplasm of colon: Secondary | ICD-10-CM | POA: Insufficient documentation

## 2020-04-03 DIAGNOSIS — Z79899 Other long term (current) drug therapy: Secondary | ICD-10-CM | POA: Insufficient documentation

## 2020-04-03 DIAGNOSIS — K635 Polyp of colon: Secondary | ICD-10-CM | POA: Diagnosis not present

## 2020-04-03 DIAGNOSIS — D122 Benign neoplasm of ascending colon: Secondary | ICD-10-CM | POA: Diagnosis not present

## 2020-04-03 DIAGNOSIS — Z885 Allergy status to narcotic agent status: Secondary | ICD-10-CM | POA: Diagnosis not present

## 2020-04-03 DIAGNOSIS — Z20822 Contact with and (suspected) exposure to covid-19: Secondary | ICD-10-CM | POA: Diagnosis not present

## 2020-04-03 DIAGNOSIS — K648 Other hemorrhoids: Secondary | ICD-10-CM | POA: Insufficient documentation

## 2020-04-03 HISTORY — PX: POLYPECTOMY: SHX5525

## 2020-04-03 HISTORY — PX: COLONOSCOPY WITH PROPOFOL: SHX5780

## 2020-04-03 SURGERY — COLONOSCOPY WITH PROPOFOL
Anesthesia: General

## 2020-04-03 MED ORDER — LACTATED RINGERS IV SOLN: Freq: Once | INTRAVENOUS | Status: AC

## 2020-04-03 MED ORDER — LACTATED RINGERS IV SOLN: INTRAVENOUS | Status: DC | PRN

## 2020-04-03 MED ORDER — PROPOFOL 10 MG/ML IV BOLUS
INTRAVENOUS | Status: DC | PRN
  Administered 2020-04-03: 150 ug/kg/min via INTRAVENOUS
  Administered 2020-04-03: 100 mg via INTRAVENOUS

## 2020-04-03 MED ORDER — CHLORHEXIDINE GLUCONATE CLOTH 2 % EX PADS: 6.0000 | MEDICATED_PAD | Freq: Once | CUTANEOUS | Status: DC

## 2020-04-03 NOTE — Anesthesia Preprocedure Evaluation (Signed)
Anesthesia Evaluation  Patient identified by MRN, date of birth, ID band Patient awake    History of Anesthesia Complications Negative for: history of anesthetic complications  Airway Mallampati: II  TM Distance: >3 FB Neck ROM: Full    Dental  (+) Missing, Dental Advisory Given   Pulmonary neg pulmonary ROS,    Pulmonary exam normal breath sounds clear to auscultation       Cardiovascular Exercise Tolerance: Good hypertension, Pt. on medications Normal cardiovascular exam Rhythm:Regular Rate:Normal     Neuro/Psych negative neurological ROS     GI/Hepatic GERD  Medicated and Controlled,  Endo/Other  negative endocrine ROS  Renal/GU negative Renal ROS  negative genitourinary   Musculoskeletal negative musculoskeletal ROS (+)   Abdominal   Peds negative pediatric ROS (+)  Hematology negative hematology ROS (+)   Anesthesia Other Findings   Reproductive/Obstetrics negative OB ROS                             Anesthesia Physical Anesthesia Plan  ASA: II  Anesthesia Plan: General   Post-op Pain Management:    Induction: Intravenous  PONV Risk Score and Plan: TIVA  Airway Management Planned: Nasal Cannula, Natural Airway and Simple Face Mask  Additional Equipment:   Intra-op Plan:   Post-operative Plan:   Informed Consent: I have reviewed the patients History and Physical, chart, labs and discussed the procedure including the risks, benefits and alternatives for the proposed anesthesia with the patient or authorized representative who has indicated his/her understanding and acceptance.     Dental advisory given  Plan Discussed with: CRNA and Surgeon  Anesthesia Plan Comments:         Anesthesia Quick Evaluation

## 2020-04-03 NOTE — H&P (Signed)
Primary Care Physician:  Zhou-Talbert, Elwyn Lade, MD Primary Gastroenterologist:  Dr. Abbey Chatters  Pre-Procedure History & Physical: HPI:  Carrie Evans is a 54 y.o. female is here for a colonoscopy for colon cancer screening purposes.  Patient denies any family history of colorectal cancer.  No melena or hematochezia.  No abdominal pain or unintentional weight loss.  No change in bowel habits.  Overall feels well from a GI standpoint.  Past Medical History:  Diagnosis Date  . Hypertension     Past Surgical History:  Procedure Laterality Date  . CHOLECYSTECTOMY    . OVARIAN CYST REMOVAL      Prior to Admission medications   Medication Sig Start Date End Date Taking? Authorizing Provider  amLODipine (NORVASC) 5 MG tablet Take 5 mg by mouth daily.  10/19/17  Yes [provider]  azelastine (ASTELIN) 0.1 % nasal spray Place 1 spray into both nostrils 2 (two) times daily. 12/08/19  Yes [provider]  cetirizine (ZYRTEC) 10 MG tablet Take 10 mg by mouth daily. 01/26/20  Yes [provider]  cholecalciferol (VITAMIN D3) 25 MCG (1000 UNIT) tablet Take 1,000 Units by mouth daily.   Yes [provider]  hydrochlorothiazide (HYDRODIURIL) 12.5 MG tablet Take 12.5 mg by mouth daily.  09/16/17  Yes [provider]  Omega-3 Fatty Acids (FISH OIL) 1200 MG CAPS Take 1,200 mg by mouth daily.   Yes [provider]  pantoprazole (PROTONIX) 20 MG tablet Take 20 mg by mouth daily.   Yes [provider]  potassium chloride SA (KLOR-CON) 20 MEQ tablet Take 20 mEq by mouth daily.   Yes [provider]  simethicone (MYLICON) 364 MG chewable tablet Thoroughly chew 125 to 250 mg orally as needed after meals and at bedtime; MAX 750 mg/day Patient not taking: Reported on 03/31/2020 11/16/19   Stacey Drain, [place], PA-C  esomeprazole (NEXIUM) 40 MG capsule Take 40 mg by mouth daily at 12 noon.  11/16/19  [provider]    Allergies as of 02/17/2020 -  Review Complete 02/15/2020  Allergen Reaction Noted  . Codeine Nausea And Vomiting 01/11/2011    Family History  Problem Relation Age of Onset  . Breast cancer Cousin        Mother's niece    Social History   Socioeconomic History  . Marital status: Married    Spouse name: Not on file  . Number of children: Not on file  . Years of education: Not on file  . Highest education level: Not on file  Occupational History  . Not on file  Tobacco Use  . Smoking status: Never Smoker  . Smokeless tobacco: Never Used  Vaping Use  . Vaping Use: Never used  Substance and Sexual Activity  . Alcohol use: No  . Drug use: No  . Sexual activity: Not on file  Other Topics Concern  . Not on file  Social History Narrative  . Not on file   Social Determinants of Health   Financial Resource Strain:   . Difficulty of Paying Living Expenses: Not on file  Food Insecurity:   . Worried About Charity fundraiser in the Last Year: Not on file  . Ran Out of Food in the Last Year: Not on file  Transportation Needs:   . Lack of Transportation (Medical): Not on file  . Lack of Transportation (Non-Medical): Not on file  Physical Activity:   . Days of Exercise per Week: Not on file  . Minutes  of Exercise per Session: Not on file  Stress:   . Feeling of Stress : Not on file  Social Connections:   . Frequency of Communication with Friends and Family: Not on file  . Frequency of Social Gatherings with Friends and Family: Not on file  . Attends Religious Services: Not on file  . Active Member of Clubs or Organizations: Not on file  . Attends Archivist Meetings: Not on file  . Marital Status: Not on file  Intimate Partner Violence:   . Fear of Current or Ex-Partner: Not on file  . Emotionally Abused: Not on file  . Physically Abused: Not on file  . Sexually Abused: Not on file    Review of Systems: See HPI, otherwise negative ROS  Impression/Plan: Carrie Evans is here for a  colonoscopy to be performed for colon cancer screening purposes.  The risks of the procedure including infection, bleed, or perforation as well as benefits, limitations, alternatives and imponderables have been reviewed with the patient. Questions have been answered. All parties agreeable.

## 2020-04-03 NOTE — Transfer of Care (Signed)
Immediate Anesthesia Transfer of Care Note  Patient: Carrie Evans  Procedure(s) Performed: COLONOSCOPY WITH PROPOFOL (N/A ) POLYPECTOMY  Patient Location: Endoscopy Unit  Anesthesia Type:General  Level of Consciousness: awake, alert , oriented and patient cooperative  Airway & Oxygen Therapy: Patient Spontanous Breathing  Post-op Assessment: Report given to RN, Post -op Vital signs reviewed and stable and Patient moving all extremities  Post vital signs: Reviewed and stable  Last Vitals:  Vitals Value Taken Time  BP    Temp    Pulse    Resp    SpO2      Last Pain:  Vitals:   04/03/20 0826  TempSrc:   PainSc: 0-No pain      Patients Stated Pain Goal: 7 (88/28/00 3491)  Complications: No complications documented.

## 2020-04-03 NOTE — Op Note (Signed)
[facility] Patient Name: Carrie Evans Procedure Date: 04/03/2020 8:24 AM MRN: [id number] Date of Birth: Apr 29, 1966 Attending MD: Elon Alas. Edgar Frisk CSN: [id number] Age: 54 Admit Type: Outpatient Procedure:                Colonoscopy Indications:              Screening for colorectal malignant neoplasm Providers:                Elon Alas. Jaylie Neaves, DO, Otis Peak B. Sharon Seller, RN,                            Nelma Rothman, Technician Referring MD:              Medicines:                See the Anesthesia note for documentation of the                            administered medications Complications:            No immediate complications. Estimated Blood Loss:     Estimated blood loss was minimal. Procedure:                Pre-Anesthesia Assessment:                           - The anesthesia plan was to use monitored                            anesthesia care (MAC).                           After obtaining informed consent, the colonoscope                            was passed under direct vision. Throughout the                            procedure, the patient's blood pressure, pulse, and                            oxygen saturations were monitored continuously. The                            [id number]-HQ190L ([id number]) scope was introduced through                            the anus and advanced to the the cecum, identified                            by appendiceal orifice and ileocecal valve. The                            colonoscopy was performed without difficulty. The                            patient tolerated the procedure  well. The quality                            of the bowel preparation was evaluated using the                            BBPS [facility] Bowel Preparation Scale) with scores                            of: Right Colon = 2 (minor amount of residual                            staining, small fragments of stool and/or opaque                            liquid, but mucosa  seen well), Transverse Colon = 3                            (entire mucosa seen well with no residual staining,                            small fragments of stool or opaque liquid) and Left                            Colon = 3 (entire mucosa seen well with no residual                            staining, small fragments of stool or opaque                            liquid). The total BBPS score equals 8. The quality                            of the bowel preparation was good. Scope In: 8:28:00 AM Scope Out: 8:45:51 AM Scope Withdrawal Time: 0 hours 13 minutes 0 seconds  Total Procedure Duration: 0 hours 17 minutes 51 seconds  Findings:      The perianal and digital rectal examinations were normal.      Non-bleeding internal hemorrhoids were found during endoscopy.      A 3 mm polyp was found in the ascending colon. The polyp was sessile.       The polyp was removed with a cold snare. Resection and retrieval were       complete. Impression:               - Non-bleeding internal hemorrhoids.                           - One 3 mm polyp in the ascending colon, removed                            with a cold snare. Resected and retrieved. Moderate Sedation:      Per Anesthesia Care Recommendation:           - Patient  has a contact number available for                            emergencies. The signs and symptoms of potential                            delayed complications were discussed with the                            patient. Return to normal activities tomorrow.                            Written discharge instructions were provided to the                            patient.                           - Resume previous diet.                           - Continue present medications.                           - Await pathology results.                           - Repeat colonoscopy in 7 years for surveillance.                           - Return to GI clinic in 2 months to discuss  reflux. Procedure Code(s):        --- Professional ---                           [phone], Colonoscopy, flexible; with removal of                            tumor(s), polyp(s), or other lesion(s) by snare                            technique Diagnosis Code(s):        --- Professional ---                           Z12.11, Encounter for screening for malignant                            neoplasm of colon                           K63.5, Polyp of colon                           K64.8, Other hemorrhoids CPT copyright 2019 American Medical Association. All rights reserved. The codes documented in this report are preliminary and upon coder review may  be revised to meet current compliance requirements. Juanda Crumble  Arlee Muslim, DO Elon Alas. Abbey Chatters, DO 04/03/2020 8:47:51 AM This report has been signed electronically. Number of Addenda: 0

## 2020-04-03 NOTE — Anesthesia Postprocedure Evaluation (Signed)
Anesthesia Post Note  Patient: Carrie Evans  Procedure(s) Performed: COLONOSCOPY WITH PROPOFOL (N/A ) POLYPECTOMY  Patient location during evaluation: Endoscopy Anesthesia Type: General Level of consciousness: awake, oriented, awake and alert and patient cooperative Pain management: pain level controlled Vital Signs Assessment: post-procedure vital signs reviewed and stable Respiratory status: spontaneous breathing, respiratory function stable and nonlabored ventilation Cardiovascular status: blood pressure returned to baseline and stable Postop Assessment: no headache and no backache Anesthetic complications: no   No complications documented.   Last Vitals:  Vitals:   04/03/20 0716  BP: 130/80  Pulse: 89  Resp: (!) 21  Temp: 36.9 C  SpO2: 100%    Last Pain:  Vitals:   04/03/20 0826  TempSrc:   PainSc: 0-No pain                 Tacy Learn

## 2020-04-03 NOTE — Discharge Instructions (Addendum)
Colon Polyps  Polyps are tissue growths inside the body. Polyps can grow in many places, including the large intestine (colon). A polyp may be a round bump or a mushroom-shaped growth. You could have one polyp or several. Most colon polyps are noncancerous (benign). However, some colon polyps can become cancerous over time. Finding and removing the polyps early can help prevent this. What are the causes? The exact cause of colon polyps is not known. What increases the risk? You are more likely to develop this condition if you:  Have a family history of colon cancer or colon polyps.  Are older than 55 or older than 45 if you are African American.  Have inflammatory bowel disease, such as ulcerative colitis or Crohn's disease.  Have certain hereditary conditions, such as: ? Familial adenomatous polyposis. ? Lynch syndrome. ? Turcot syndrome. ? Peutz-Jeghers syndrome.  Are overweight.  Smoke cigarettes.  Do not get enough exercise.  Drink too much alcohol.  Eat a diet that is high in fat and red meat and low in fiber.  Had childhood cancer that was treated with abdominal radiation. What are the signs or symptoms? Most polyps do not cause symptoms. If you have symptoms, they may include:  Blood coming from your rectum when having a bowel movement.  Blood in your stool. The stool may look dark red or black.  Abdominal pain.  A change in bowel habits, such as constipation or diarrhea. How is this diagnosed? This condition is diagnosed with a colonoscopy. This is a procedure in which a lighted, flexible scope is inserted into the anus and then passed into the colon to examine the area. Polyps are sometimes found when a colonoscopy is done as part of routine cancer screening tests. How is this treated? Treatment for this condition involves removing any polyps that are found. Most polyps can be removed during a colonoscopy. Those polyps will then be tested for cancer. Additional  treatment may be needed depending on the results of testing. Follow these instructions at home: Lifestyle  Maintain a healthy weight, or lose weight if recommended by your health care provider.  Exercise every day or as told by your health care provider.  Do not use any products that contain nicotine or tobacco, such as cigarettes and e-cigarettes. If you need help quitting, ask your health care provider.  If you drink alcohol, limit how much you have: ? 0-1 drink a day for women. ? 0-2 drinks a day for men.  Be aware of how much alcohol is in your drink. In the [place] one drink equals one 12 oz bottle of beer (355 mL), one 5 oz glass of wine (148 mL), or one 1 oz shot of hard liquor (44 mL). Eating and drinking   Eat foods that are high in fiber, such as fruits, vegetables, and whole grains.  Eat foods that are high in calcium and vitamin D, such as milk, cheese, yogurt, eggs, liver, fish, and broccoli.  Limit foods that are high in fat, such as fried foods and desserts.  Limit the amount of red meat and processed meat you eat, such as hot dogs, sausage, bacon, and lunch meats. General instructions  Keep all follow-up visits as told by your health care provider. This is important. ? This includes having regularly scheduled colonoscopies. ? Talk to your health care provider about when you need a colonoscopy. Contact a health care provider if:  You have new or worsening bleeding during a bowel movement.  You  have new or increased blood in your stool.  You have a change in bowel habits.  You lose weight for no known reason. Summary  Polyps are tissue growths inside the body. Polyps can grow in many places, including the colon.  Most colon polyps are noncancerous (benign), but some can become cancerous over time.  This condition is diagnosed with a colonoscopy.  Treatment for this condition involves removing any polyps that are found. Most polyps can be removed during a  colonoscopy. This information is not intended to replace advice given to you by your health care provider. Make sure you discuss any questions you have with your health care provider. Document Revised: 08/14/2017 Document Reviewed: 08/14/2017 Elsevier Patient Education  [place].  Colonoscopy Discharge Instructions  Read the instructions outlined below and refer to this sheet in the next few weeks. These discharge instructions provide you with general information on caring for yourself after you leave the hospital. Your doctor may also give you specific instructions. While your treatment has been planned according to the most current medical practices available, unavoidable complications occasionally occur.   ACTIVITY  You may resume your regular activity, but move at a slower pace for the next 24 hours.   Take frequent rest periods for the next 24 hours.   Walking will help get rid of the air and reduce the bloated feeling in your belly (abdomen).   No driving for 24 hours (because of the medicine (anesthesia) used during the test).    Do not sign any important legal documents or operate any machinery for 24 hours (because of the anesthesia used during the test).  NUTRITION  Drink plenty of fluids.   You may resume your normal diet as instructed by your doctor.   Begin with a light meal and progress to your normal diet. Heavy or fried foods are harder to digest and may make you feel sick to your stomach (nauseated).   Avoid alcoholic beverages for 24 hours or as instructed.  MEDICATIONS  You may resume your normal medications unless your doctor tells you otherwise.  WHAT YOU CAN EXPECT TODAY  Some feelings of bloating in the abdomen.   Passage of more gas than usual.   Spotting of blood in your stool or on the toilet paper.  IF YOU HAD POLYPS REMOVED DURING THE COLONOSCOPY:  No aspirin products for 7 days or as instructed.   No alcohol for 7 days or as  instructed.   Eat a soft diet for the next 24 hours.  FINDING OUT THE RESULTS OF YOUR TEST Not all test results are available during your visit. If your test results are not back during the visit, make an appointment with your caregiver to find out the results. Do not assume everything is normal if you have not heard from your caregiver or the medical facility. It is important for you to follow up on all of your test results.  SEEK IMMEDIATE MEDICAL ATTENTION IF:  You have more than a spotting of blood in your stool.   Your belly is swollen (abdominal distention).   You are nauseated or vomiting.   You have a temperature over 101.   You have abdominal pain or discomfort that is severe or gets worse throughout the day.   Your colonoscopy revealed 1 polyp(s) which I removed successfully. Await pathology results, my office will contact you. I recommend repeating colonoscopy in 7-10 years for surveillance purposes.   Follow up with GI in 2  months to discuss reflux and abdominal pain    I hope you have a great rest of your week!  Elon Alas. Abbey Chatters, D.O. Gastroenterology and Hepatology [facility]

## 2020-04-04 ENCOUNTER — Ambulatory Visit
Admission: RE | Admit: 2020-04-04 | Discharge: 2020-04-04 | Disposition: A | Discharge status: Home / Self Care | Payer: BC Managed Care – PPO | Source: Ambulatory Visit | Attending: Family Medicine | Admitting: Family Medicine

## 2020-04-04 DIAGNOSIS — R928 Other abnormal and inconclusive findings on diagnostic imaging of breast: Secondary | ICD-10-CM

## 2020-04-04 DIAGNOSIS — N6022 Fibroadenosis of left breast: Secondary | ICD-10-CM | POA: Diagnosis not present

## 2020-04-04 LAB — SURGICAL PATHOLOGY

## 2020-04-10 ENCOUNTER — Encounter (HOSPITAL_COMMUNITY): Payer: Self-pay | Admitting: Internal Medicine

## 2020-04-13 NOTE — Progress Notes (Signed)
Dear Mrs. Layson  We have tried to contact you on several occasions regarding your results. Please call our office at [phone] at your earliest convenience.    Thank you, Floria Raveling

## 2020-04-24 NOTE — Progress Notes (Signed)
On recall  °

## 2020-06-06 ENCOUNTER — Ambulatory Visit: Payer: BC Managed Care – PPO | Admitting: Gastroenterology

## 2020-06-14 ENCOUNTER — Ambulatory Visit (INDEPENDENT_AMBULATORY_CARE_PROVIDER_SITE_OTHER): Payer: BC Managed Care – PPO | Admitting: Gastroenterology

## 2020-06-14 ENCOUNTER — Other Ambulatory Visit: Payer: Self-pay

## 2020-06-14 ENCOUNTER — Encounter: Payer: Self-pay | Admitting: Gastroenterology

## 2020-06-14 DIAGNOSIS — K580 Irritable bowel syndrome with diarrhea: Secondary | ICD-10-CM | POA: Diagnosis not present

## 2020-06-14 DIAGNOSIS — K219 Gastro-esophageal reflux disease without esophagitis: Secondary | ICD-10-CM | POA: Insufficient documentation

## 2020-06-14 DIAGNOSIS — K589 Irritable bowel syndrome without diarrhea: Secondary | ICD-10-CM | POA: Insufficient documentation

## 2020-06-14 MED ORDER — DICYCLOMINE HCL 10 MG PO CAPS: ORAL_CAPSULE | ORAL | 1 refills | Status: DC

## 2020-06-14 NOTE — Progress Notes (Signed)
Primary Care Physician: Zhou-Talbert, Elwyn Lade, MD  Primary Gastroenterologist:  Elon Alas. Abbey Chatters, DO   Chief Complaint  Patient presents with  . Gastroesophageal Reflux    Has flares off/on, gas, burning sensation    HPI: Carrie Evans is a 55 y.o. female here for further evaluation of GI concerns.  She last was seen at time of screening colonoscopy back in 03/2020. She had single tubular adenoma removed, next colonoscopy recommended in 7 years.   She presents today with complaints of episodes of abdominal griping, "bubbling" associated with increased stool. She felt like symptoms triggered by certain foods, eating too late at night. She added probiotic and stools are more formed now. Felt like she was doing better but this morning had recurrent abdominal griping and loose stool. She recalls drinking crystal light (artificial sweetener) last night. Since gallbladder removed, she does not tolerate greasy foods especially in the mornings. Dairy aggravates stomach.   Patient complains of reflux. Had flare in 11/2019, vomiting with that episode. Had to go to urgent care. GI cocktail helped. Triggered by spicy foods. Last flare couple of days ago. Complains of upper abdominal gas. Has been on pantoprazole for one year. Prior to that on omeprazole but it stopped working. On PPI off/on for several years, at least five years. Has to be very careful with her diet but has been able to control symptoms. No eating after six. Denies dysphagia. She wonders about coming off medication.   Current Outpatient Medications  Medication Sig Dispense Refill  . amLODipine (NORVASC) 5 MG tablet Take 5 mg by mouth daily.   1  . azelastine (ASTELIN) 0.1 % nasal spray Place 1 spray into both nostrils 2 (two) times daily.    . Black Cohosh 540 MG CAPS Take by mouth daily.    . cetirizine (ZYRTEC) 10 MG tablet Take 10 mg by mouth daily.    . cholecalciferol (VITAMIN D3) 25 MCG (1000 UNIT) tablet Take 1,000 Units  by mouth daily.    . hydrochlorothiazide (HYDRODIURIL) 12.5 MG tablet Take 12.5 mg by mouth daily.   2  . lactobacillus acidophilus (BACID) TABS tablet Take 1 tablet by mouth daily.    . Omega-3 Fatty Acids (FISH OIL) 1200 MG CAPS Take 1,576 mg by mouth daily.    . pantoprazole (PROTONIX) 20 MG tablet Take 20 mg by mouth daily.    . potassium chloride SA (KLOR-CON) 20 MEQ tablet Take 20 mEq by mouth daily.     No current facility-administered medications for this visit.    Allergies as of 06/14/2020 - Review Complete 06/14/2020  Allergen Reaction Noted  . Codeine Nausea And Vomiting 01/11/2011    ROS:  General: Negative for anorexia, weight loss, fever, chills, fatigue, weakness. ENT: Negative for hoarseness, difficulty swallowing , nasal congestion. CV: Negative for chest pain, angina, palpitations, dyspnea on exertion, peripheral edema.  Respiratory: Negative for dyspnea at rest, dyspnea on exertion, cough, sputum, wheezing.  GI: See history of present illness. GU:  Negative for dysuria, hematuria, urinary incontinence, urinary frequency, nocturnal urination.  Endo: Negative for unusual weight change.    Physical Examination:   BP 137/89   Pulse 70   Temp 97.6 F (36.4 C)   Ht 5\' 7"  (1.702 m)   Wt 231 lb 9.6 oz (105.1 kg)   LMP 12/18/2010   BMI 36.27 kg/m   General: Well-nourished, well-developed in no acute distress.  Eyes: No icterus. Mouth: masked Lungs: Clear to auscultation bilaterally.  Heart: Regular rate and rhythm, no murmurs rubs or gallops.  Abdomen: Bowel sounds are normal, nontender, nondistended, no hepatosplenomegaly or masses, no abdominal bruits or hernia , no rebound or guarding.   Extremities: No lower extremity edema. No clubbing or deformities. Neuro: Alert and oriented x 4   Skin: Warm and dry, no jaundice.   Psych: Alert and cooperative, normal mood and affect.  Assessment:  55 y/o female presenting for follow up of screening colonoscopy and  to discuss GERD and bowel concerns.   Abdominal cramping associated with frequent stools. Symptoms chronic in nature, worse since gallbladder removed years ago. She has noted improvement with dietary modifications, avoiding eating late at night, adding probiotics. She may have element of bile salt diarrhea, food intolerance, or IBS. Recent colonoscopy reassuring. May require further work up if persistent symptoms despite avoiding trigger foods and trial of antispasmotic.   GERD, chronic. Has been on PPI for years. She wonders about trying to come off medications and control with diet and behavioral modifications.   Plan:  1. Continue to avoid greasy/spicy foods.  2. Reduce artificial sweeteners.  3. Continue probiotics for four weeks.  4. Avoiding eating late at night.  5. Avoid eating large meals, try eating smaller portions more frequently.  6. With next episode of abdominal cramping/loose stools, try bentyl 10mg  up to four times daily. 7. Keep calendar of symptoms, any foods that may have triggered, how many stools, etc. She will get copy to me in few weeks.  8. Stop pantoprazole. If recurrent heartburn, resume daily. 9. Return to the office in 3 months or sooner if needed. Based on how she is doing at that time, may consider EGD.

## 2020-06-14 NOTE — Patient Instructions (Signed)
Your symptoms of abdominal cramping and frequent stools could be secondary to food intolerance, due to lack of gallbladder, or irritable bowel.  1. Try to eliminate greasy/spicy foods.  2. Avoid artificial sweeteners especially at night.  3. Continue probiotics for four weeks. 4. Avoid eating late at night. 5. Avoid eating large meals, try eating smaller portions more frequently.  6. With next episode, try dicyclomine 10mg  up to four times daily.  7. Keep a calendar of symptoms, any foods that may have triggered, how many stools, etc. Send to me after you have few weeks charted. 8. Return to the office in 3 months.   For your acid reflux.  1. Try stopping your pantoprazole if tolerated. If you have recurrent heartburn, you can resume daily.    Diet for Irritable Bowel Syndrome When you have irritable bowel syndrome (IBS), it is very important to eat the foods and follow the eating habits that are best for your condition. IBS may cause various symptoms such as pain in the abdomen, constipation, or diarrhea. Choosing the right foods can help to ease the discomfort from these symptoms. Work with your health care provider and diet and nutrition specialist (dietitian) to find the eating plan that will help to control your symptoms. What are tips for following this plan?  Keep a food diary. This will help you identify foods that cause symptoms. Write down: ? What you eat and when you eat it. ? What symptoms you have. ? When symptoms occur in relation to your meals, such as "pain in abdomen 2 hours after dinner."  Eat your meals slowly and in a relaxed setting.  Aim to eat 5-6 small meals per day. Do not skip meals.  Drink enough fluid to keep your urine pale yellow.  Ask your health care provider if you should take an over-the-counter probiotic to help restore healthy bacteria in your gut (digestive tract). ? Probiotics are foods that contain good bacteria and yeasts.  Your dietitian may  have specific dietary recommendations for you based on your symptoms. He or she may recommend that you: ? Avoid foods that cause symptoms. Talk with your dietitian about other ways to get the same nutrients that are in those problem foods. ? Avoid foods with gluten. Gluten is a protein that is found in rye, wheat, and barley. ? Eat more foods that contain soluble fiber. Examples of foods with high soluble fiber include oats, seeds, and certain fruits and vegetables. Take a fiber supplement if directed by your dietitian. ? Reduce or avoid certain foods called FODMAPs. These are foods that contain carbohydrates that are hard to digest. Ask your doctor which foods contain these carbohydrates.      What foods are not recommended? The following are some foods and drinks that may make your symptoms worse:  Fatty foods, such as french fries.  Foods that contain gluten, such as pasta and cereal.  Dairy products, such as milk, cheese, and ice cream.  Chocolate.  Alcohol.  Products with caffeine, such as coffee.  Carbonated drinks, such as soda.  Foods that are high in FODMAPs. These include certain fruits and vegetables.  Products with sweeteners such as honey, high fructose corn syrup, sorbitol, and mannitol. The items listed above may not be a complete list of foods and beverages you should avoid. Contact a dietitian for more information.   What foods are good sources of fiber? Your health care provider or dietitian may recommend that you eat more foods that contain  fiber. Fiber can help to reduce constipation and other IBS symptoms. Add foods with fiber to your diet a little at a time so your body can get used to them. Too much fiber at one time might cause gas and swelling of your abdomen. The following are some foods that are good sources of fiber:  Berries, such as raspberries, strawberries, and blueberries.  Tomatoes.  Carrots.  Brown rice.  Oats.  Seeds, such as chia and  pumpkin seeds. The items listed above may not be a complete list of recommended sources of fiber. Contact your dietitian for more options. Where to find more information  International Foundation for Functional Gastrointestinal Disorders: www.iffgd.[employer] of Diabetes and Digestive and Kidney Diseases: [employer] Summary  When you have irritable bowel syndrome (IBS), it is very important to eat the foods and follow the eating habits that are best for your condition.  IBS may cause various symptoms such as pain in the abdomen, constipation, or diarrhea.  Choosing the right foods can help to ease the discomfort that comes from symptoms.  Keep a food diary. This will help you identify foods that cause symptoms.  Your health care provider or diet and nutrition specialist (dietitian) may recommend that you eat more foods that contain fiber. This information is not intended to replace advice given to you by your health care provider. Make sure you discuss any questions you have with your health care provider. Document Revised: 12/30/2019 Document Reviewed: 12/30/2019 Elsevier Patient Education  2021 [employer].   Irritable Bowel Syndrome, Adult  Irritable bowel syndrome (IBS) is a group of symptoms that affects the organs responsible for digestion (gastrointestinal or GI tract). IBS is not one specific disease. To regulate how the GI tract works, the body sends signals back and forth between the intestines and the brain. If you have IBS, there may be a problem with these signals. As a result, the GI tract does not function normally. The intestines may become more sensitive and overreact to certain things. This may be especially true when you eat certain foods or when you are under stress. There are four types of IBS. These may be determined based on the consistency of your stool (feces):  IBS with diarrhea.  IBS with constipation.  Mixed IBS.  Unsubtyped  IBS. It is important to know which type of IBS you have. Certain treatments are more likely to be helpful for certain types of IBS. What are the causes? The exact cause of IBS is not known. What increases the risk? You may have a higher risk for IBS if you:  Are female.  Are younger than 37.  Have a family history of IBS.  Have a mental health condition, such as depression, anxiety, or post-traumatic stress disorder.  Have had a bacterial infection of your GI tract. What are the signs or symptoms? Symptoms of IBS vary from person to person. The main symptom is abdominal pain or discomfort. Other symptoms usually include one or more of the following:  Diarrhea, constipation, or both.  Abdominal swelling or bloating.  Feeling full after eating a small or regular-sized meal.  Frequent gas.  Mucus in the stool.  A feeling of having more stool left after a bowel movement. Symptoms tend to come and go. They may be triggered by stress, mental health conditions, or certain foods. How is this diagnosed? This condition may be diagnosed based on a physical exam, your medical history, and your symptoms. You may have tests,  such as:  Blood tests.  Stool test.  X-rays.  CT scan.  Colonoscopy. This is a procedure in which your GI tract is viewed with a long, thin, flexible tube. How is this treated? There is no cure for IBS, but treatment can help relieve symptoms. Treatment depends on the type of IBS you have, and may include:  Changes to your diet, such as: ? Avoiding foods that cause symptoms. ? Drinking more water. ? Following a low-FODMAP (fermentable oligosaccharides, disaccharides, monosaccharides, and polyols) diet for up to 6 weeks, or as told by your health care provider. FODMAPs are sugars that are hard for some people to digest. ? Eating more fiber. ? Eating medium-sized meals at the same times every day.  Medicines. These may include: ? Fiber supplements, if you  have constipation. ? Medicine to control diarrhea (antidiarrheal medicines). ? Medicine to help control muscle tightening (spasms) in your GI tract (antispasmodic medicines). ? Medicines to help with mental health conditions, such as antidepressants or tranquilizers.  Talk therapy or counseling.  Working with a diet and nutrition specialist (dietitian) to help create a food plan that is right for you.  Managing your stress. Follow these instructions at home: Eating and drinking  Eat a healthy diet.  Eat medium-sized meals at about the same time every day. Do not eat large meals.  Gradually eat more fiber-rich foods. These include whole grains, fruits, and vegetables. This may be especially helpful if you have IBS with constipation.  Eat a diet low in FODMAPs.  Drink enough fluid to keep your urine pale yellow.  Keep a journal of foods that seem to trigger symptoms.  Avoid foods and drinks that: ? Contain added sugar. ? Make your symptoms worse. Dairy products, caffeinated drinks, and carbonated drinks can make symptoms worse for some people. General instructions  Take over-the-counter and prescription medicines and supplements only as told by your health care provider.  Get enough exercise. Do at least 150 minutes of moderate-intensity exercise each week.  Manage your stress. Getting enough sleep and exercise can help you manage stress.  Keep all follow-up visits as told by your health care provider and therapist. This is important. Alcohol Use  Do not drink alcohol if: ? Your health care provider tells you not to drink. ? You are pregnant, may be pregnant, or are planning to become pregnant.  If you drink alcohol, limit how much you have: ? 0-1 drink a day for women. ? 0-2 drinks a day for men.  Be aware of how much alcohol is in your drink. In the [place] one drink equals one typical bottle of beer (12 oz), one-half glass of wine (5 oz), or one shot of hard liquor (1  oz). Contact a health care provider if you have:  Constant pain.  Weight loss.  Difficulty or pain when swallowing.  Diarrhea that gets worse. Get help right away if you have:  Severe abdominal pain.  Fever.  Diarrhea with symptoms of dehydration, such as dizziness or dry mouth.  Bright red blood in your stool.  Stool that is black and tarry.  Abdominal swelling.  Vomiting that does not stop.  Blood in your vomit. Summary  Irritable bowel syndrome (IBS) is not one specific disease. It is a group of symptoms that affects digestion.  Your intestines may become more sensitive and overreact to certain things. This may be especially true when you eat certain foods or when you are under stress.  There is no cure  for IBS, but treatment can help relieve symptoms. This information is not intended to replace advice given to you by your health care provider. Make sure you discuss any questions you have with your health care provider. Document Revised: 12/30/2019 Document Reviewed: 12/30/2019 Elsevier Patient Education  2021 [place] for Gastroesophageal Reflux Disease, Adult When you have gastroesophageal reflux disease (GERD), the foods you eat and your eating habits are very important. Choosing the right foods can help ease the discomfort of GERD. Consider working with a dietitian to help you make healthy food choices. What are tips for following this plan? Reading food labels  Look for foods that are low in saturated fat. Foods that have less than 5% of daily value (DV) of fat and 0 g of trans fats may help with your symptoms. Cooking  Cook foods using methods other than frying. This may include baking, steaming, grilling, or broiling. These are all methods that do not need a lot of fat for cooking.  To add flavor, try to use herbs that are low in spice and acidity. Meal planning  Choose healthy foods that are low in fat, such as fruits, vegetables,  whole grains, low-fat dairy products, lean meats, fish, and poultry.  Eat frequent, small meals instead of three large meals each day. Eat your meals slowly, in a relaxed setting. Avoid bending over or lying down until 2-3 hours after eating.  Limit high-fat foods such as fatty meats or fried foods.  Limit your intake of fatty foods, such as oils, butter, and shortening.  Avoid the following as told by your health care provider: ? Foods that cause symptoms. These may be different for different people. Keep a food diary to keep track of foods that cause symptoms. ? Alcohol. ? Drinking large amounts of liquid with meals. ? Eating meals during the 2-3 hours before bed.   Lifestyle  Maintain a healthy weight. Ask your health care provider what weight is healthy for you. If you need to lose weight, work with your health care provider to do so safely.  Exercise for at least 30 minutes on 5 or more days each week, or as told by your health care provider.  Avoid wearing clothes that fit tightly around your waist and chest.  Do not use any products that contain nicotine or tobacco. These products include cigarettes, chewing tobacco, and vaping devices, such as e-cigarettes. If you need help quitting, ask your health care provider.  Sleep with the head of your bed raised. Use a wedge under the mattress or blocks under the bed frame to raise the head of the bed.  Chew sugar-free gum after mealtimes. What foods should I eat? Eat a healthy, well-balanced diet of fruits, vegetables, whole grains, low-fat dairy products, lean meats, fish, and poultry. Each person is different. Foods that may trigger symptoms in one person may not trigger any symptoms in another person. Work with your health care provider to identify foods that are safe for you. The items listed above may not be a complete list of recommended foods and beverages. Contact a dietitian for more information.   What foods should I  avoid? Limiting some of these foods may help manage the symptoms of GERD. Everyone is different. Consult a dietitian or your health care provider to help you identify the exact foods to avoid, if any. Fruits Any fruits prepared with added fat. Any fruits that cause symptoms. For some people this may include citrus fruits,  such as oranges, grapefruit, pineapple, and lemons. Vegetables Deep-fried vegetables. [place] fries. Any vegetables prepared with added fat. Any vegetables that cause symptoms. For some people, this may include tomatoes and tomato products, chili peppers, onions and garlic, and horseradish. Grains Pastries or quick breads with added fat. Meats and other proteins High-fat meats, such as fatty beef or pork, hot dogs, ribs, ham, sausage, salami, and bacon. Fried meat or protein, including fried fish and fried chicken. Nuts and nut butters, in large amounts. Dairy Whole milk and chocolate milk. Sour cream. Cream. Ice cream. Cream cheese. Milkshakes. Fats and oils Butter. Margarine. Shortening. Ghee. Beverages Coffee and tea, with or without caffeine. Carbonated beverages. Sodas. Energy drinks. Fruit juice made with acidic fruits, such as orange or grapefruit. Tomato juice. Alcoholic drinks. Sweets and desserts Chocolate and cocoa. Donuts. Seasonings and condiments Pepper. Peppermint and spearmint. Added salt. Any condiments, herbs, or seasonings that cause symptoms. For some people, this may include curry, hot sauce, or vinegar-based salad dressings. The items listed above may not be a complete list of foods and beverages to avoid. Contact a dietitian for more information. Questions to ask your health care provider Diet and lifestyle changes are usually the first steps that are taken to manage symptoms of GERD. If diet and lifestyle changes do not improve your symptoms, talk with your health care provider about taking medicines. Where to find more information  International  Foundation for Gastrointestinal Disorders: aboutgerd.org Summary  When you have gastroesophageal reflux disease (GERD), food and lifestyle choices may be very helpful in easing the discomfort of GERD.  Eat frequent, small meals instead of three large meals each day. Eat your meals slowly, in a relaxed setting. Avoid bending over or lying down until 2-3 hours after eating.  Limit high-fat foods such as fatty meats or fried foods. This information is not intended to replace advice given to you by your health care provider. Make sure you discuss any questions you have with your health care provider. Document Revised: 11/08/2019 Document Reviewed: 11/08/2019 Elsevier Patient Education  [place].   [employer] Current Therapy 2021 (pp. 213-216). [place], [place]: Elsevier.">  Gastroesophageal Reflux Disease, Adult Gastroesophageal reflux (GER) happens when acid from the stomach flows up into the tube that connects the mouth and the stomach (esophagus). Normally, food travels down the esophagus and stays in the stomach to be digested. However, when a person has GER, food and stomach acid sometimes move back up into the esophagus. If this becomes a more serious problem, the person may be diagnosed with a disease called gastroesophageal reflux disease (GERD). GERD occurs when the reflux:  Happens often.  Causes frequent or severe symptoms.  Causes problems such as damage to the esophagus. When stomach acid comes in contact with the esophagus, the acid may cause inflammation in the esophagus. Over time, GERD may create small holes (ulcers) in the lining of the esophagus. What are the causes? This condition is caused by a problem with the muscle between the esophagus and the stomach (lower esophageal sphincter, or LES). Normally, the LES muscle closes after food passes through the esophagus to the stomach. When the LES is weakened or abnormal, it does not close properly, and that allows food and  stomach acid to go back up into the esophagus. The LES can be weakened by certain dietary substances, medicines, and medical conditions, including:  Tobacco use.  Pregnancy.  Having a hiatal hernia.  Alcohol use.  Certain foods and beverages, such as coffee,  chocolate, onions, and peppermint. What increases the risk? You are more likely to develop this condition if you:  Have an increased body weight.  Have a connective tissue disorder.  Take NSAIDs, such as ibuprofen. What are the signs or symptoms? Symptoms of this condition include:  Heartburn.  Difficult or painful swallowing and the feeling of having a lump in the throat.  A bitter taste in the mouth.  Bad breath and having a large amount of saliva.  Having an upset or bloated stomach and belching.  Chest pain. Different conditions can cause chest pain. Make sure you see your health care provider if you experience chest pain.  Shortness of breath or wheezing.  Ongoing (chronic) cough or a nighttime cough.  Wearing away of tooth enamel.  Weight loss. How is this diagnosed? This condition may be diagnosed based on a medical history and a physical exam. To determine if you have mild or severe GERD, your health care provider may also monitor how you respond to treatment. You may also have tests, including:  A test to examine your stomach and esophagus with a small camera (endoscopy).  A test that measures the acidity level in your esophagus.  A test that measures how much pressure is on your esophagus.  A barium swallow or modified barium swallow test to show the shape, size, and functioning of your esophagus. How is this treated? Treatment for this condition may vary depending on how severe your symptoms are. Your health care provider may recommend:  Changes to your diet.  Medicine.  Surgery. The goal of treatment is to help relieve your symptoms and to prevent complications. Follow these instructions at  home: Eating and drinking  Follow a diet as recommended by your health care provider. This may involve avoiding foods and drinks such as: ? Coffee and tea, with or without caffeine. ? Drinks that contain alcohol. ? Energy drinks and sports drinks. ? Carbonated drinks or sodas. ? Chocolate and cocoa. ? Peppermint and mint flavorings. ? Garlic and onions. ? Horseradish. ? Spicy and acidic foods, including peppers, chili powder, curry powder, vinegar, hot sauces, and barbecue sauce. ? Citrus fruit juices and citrus fruits, such as oranges, lemons, and limes. ? Tomato-based foods, such as red sauce, chili, salsa, and pizza with red sauce. ? Fried and fatty foods, such as donuts, french fries, potato chips, and high-fat dressings. ? High-fat meats, such as hot dogs and fatty cuts of red and white meats, such as rib eye steak, sausage, ham, and bacon. ? High-fat dairy items, such as whole milk, butter, and cream cheese.  Eat small, frequent meals instead of large meals.  Avoid drinking large amounts of liquid with your meals.  Avoid eating meals during the 2-3 hours before bedtime.  Avoid lying down right after you eat.  Do not exercise right after you eat.   Lifestyle  Do not use any products that contain nicotine or tobacco. These products include cigarettes, chewing tobacco, and vaping devices, such as e-cigarettes. If you need help quitting, ask your health care provider.  Try to reduce your stress by using methods such as yoga or meditation. If you need help reducing stress, ask your health care provider.  If you are overweight, reduce your weight to an amount that is healthy for you. Ask your health care provider for guidance about a safe weight loss goal.   General instructions  Pay attention to any changes in your symptoms.  Take over-the-counter and prescription medicines only  as told by your health care provider. Do not take aspirin, ibuprofen, or other NSAIDs unless your  health care provider told you to take these medicines.  Wear loose-fitting clothing. Do not wear anything tight around your waist that causes pressure on your abdomen.  Raise (elevate) the head of your bed about 6 inches (15 cm). You can use a wedge to do this.  Avoid bending over if this makes your symptoms worse.  Keep all follow-up visits. This is important. Contact a health care provider if:  You have: ? New symptoms. ? Unexplained weight loss. ? Difficulty swallowing or it hurts to swallow. ? Wheezing or a persistent cough. ? A hoarse voice.  Your symptoms do not improve with treatment. Get help right away if:  You have sudden pain in your arms, neck, jaw, teeth, or back.  You suddenly feel sweaty, dizzy, or light-headed.  You have chest pain or shortness of breath.  You vomit and the vomit is green, yellow, or black, or it looks like blood or coffee grounds.  You faint.  You have stool that is red, bloody, or black.  You cannot swallow, drink, or eat. These symptoms may represent a serious problem that is an emergency. Do not wait to see if the symptoms will go away. Get medical help right away. Call your local emergency services (911 in the [place] Do not drive yourself to the hospital. Summary  Gastroesophageal reflux happens when acid from the stomach flows up into the esophagus. GERD is a disease in which the reflux happens often, causes frequent or severe symptoms, or causes problems such as damage to the esophagus.  Treatment for this condition may vary depending on how severe your symptoms are. Your health care provider may recommend diet and lifestyle changes, medicine, or surgery.  Contact a health care provider if you have new or worsening symptoms.  Take over-the-counter and prescription medicines only as told by your health care provider. Do not take aspirin, ibuprofen, or other NSAIDs unless your health care provider told you to do so.  Keep all  follow-up visits as told by your health care provider. This is important. This information is not intended to replace advice given to you by your health care provider. Make sure you discuss any questions you have with your health care provider. Document Revised: 11/08/2019 Document Reviewed: 11/08/2019 Elsevier Patient Education  [place].

## 2020-06-15 ENCOUNTER — Encounter: Payer: Self-pay | Admitting: Internal Medicine

## 2020-06-16 ENCOUNTER — Encounter: Payer: Self-pay | Admitting: Gastroenterology

## 2020-06-29 DIAGNOSIS — E559 Vitamin D deficiency, unspecified: Secondary | ICD-10-CM | POA: Diagnosis not present

## 2020-06-29 DIAGNOSIS — I10 Essential (primary) hypertension: Secondary | ICD-10-CM | POA: Diagnosis not present

## 2020-08-17 ENCOUNTER — Ambulatory Visit (HOSPITAL_COMMUNITY): Payer: BC Managed Care – PPO | Attending: Family Medicine | Admitting: Physical Therapy

## 2020-08-17 ENCOUNTER — Encounter (HOSPITAL_COMMUNITY): Payer: Self-pay | Admitting: Physical Therapy

## 2020-08-17 ENCOUNTER — Other Ambulatory Visit: Payer: Self-pay

## 2020-08-17 DIAGNOSIS — M25562 Pain in left knee: Secondary | ICD-10-CM

## 2020-08-17 DIAGNOSIS — G8929 Other chronic pain: Secondary | ICD-10-CM

## 2020-08-17 DIAGNOSIS — M25561 Pain in right knee: Secondary | ICD-10-CM | POA: Diagnosis present

## 2020-08-17 DIAGNOSIS — M6281 Muscle weakness (generalized): Secondary | ICD-10-CM | POA: Diagnosis present

## 2020-08-17 NOTE — Therapy (Signed)
[place], [place], [address] Phone: [phone]   Fax:  [phone]  Physical Therapy Evaluation  Patient Details  Name: Carrie Evans MRN: [id number] Date of Birth: 11-19-1965 Referring Provider (PT): Curtis Sites, MD   Encounter Date: 08/17/2020   PT End of Session - 08/17/20 1539    Visit Number 1    Number of Visits 6    Date for PT Re-Evaluation 09/28/20    Authorization Type BCBS COMMPPO, VL 31 between PT/OT chiro, no auth , [employer] manages as secondary no auth or VL    Authorization - Visit Number 1    Authorization - Number of Visits 31    Progress Note Due on Visit 10    PT Start Time 1539   pt late to session by 9 minutes   PT Stop Time 1610    PT Time Calculation (min) 31 min    Activity Tolerance Patient tolerated treatment well    Behavior During Therapy [facility] for tasks assessed/performed           Past Medical History:  Diagnosis Date  . Hypertension     Past Surgical History:  Procedure Laterality Date  . CHOLECYSTECTOMY     late 1990s  . COLONOSCOPY WITH PROPOFOL N/A 04/03/2020   [facility]: Single tubular adenoma removed.  Next colonoscopy 7 years.  . OVARIAN CYST REMOVAL    . POLYPECTOMY  04/03/2020   Procedure: POLYPECTOMY;  Surgeon: Eloise Harman, DO;  [facility];  Service: Endoscopy;;    There were no vitals filed for this visit.    Subjective Assessment - 08/17/20 1547    Subjective States she ahs been having bilateral knee pain with right worse then left and pain in the front on both sides but also in the back on the right. States it has been on and off for about 6 months. States that back knee pain on the right knee started about 2 months. States that full straightening and when she first gets up from sitting or if she turns it certain way she feels it. States MD gave her gabapentin for her knees but has not started taking.    Pertinent History scoliosis     Currently in Pain? Yes    Pain Score 3     Pain Location Knee    Pain Orientation Right;Posterior;Anterior    Pain Descriptors / Indicators Sore;Tightness    Pain Type Chronic pain    Aggravating Factors  movement    Pain Relieving Factors rest              [facility] PT Assessment - 08/17/20 0001      Assessment   Medical Diagnosis bilateral knee pain    Referring Provider (PT) Curtis Sites, MD    Next MD Visit 09/27/20      Balance Screen   Has the patient fallen in the past 6 months No      Prior Function   Level of Independence Independent      Cognition   Overall Cognitive Status Within Functional Limits for tasks assessed      Observation/Other Assessments   Observations hyper-extends bilaterally in knees    Focus on Therapeutic Outcomes (FOTO)  59% function      ROM / Strength   AROM / PROM / Strength AROM;Strength      AROM   AROM Assessment Site Knee    Right/Left Knee Left;Right  Right Knee Extension 4   hyperextension   Right Knee Flexion 122    Left Knee Extension 6   hyperextension   Left Knee Flexion 122      Strength   Overall Strength Comments SLR able to perform 5 SLR with no extension lag but patient reported it to be fatiguing    Strength Assessment Site Hip;Knee;Ankle    Right/Left Hip Right;Left    Right/Left Knee Right;Left    Right Knee Flexion 4/5   pain on front of knee   Right Knee Extension 4+/5   pain on front of knee   Left Knee Flexion 4/5    Left Knee Extension 5/5    Right/Left Ankle Left;Right    Right Ankle Dorsiflexion 5/5    Right Ankle Plantar Flexion --   7 single heel raises   Left Ankle Dorsiflexion 5/5    Left Ankle Plantar Flexion --   9 single leg heel raises     Palpation   Patella mobility hypermobility    Palpation comment tenderness to palpation along medial knees and along right posterior hamstring into knee. Increased tone in right hamstring                      Objective measurements  completed on examination: See above findings.       [facility] Adult PT Treatment/Exercise - 08/17/20 0001      Exercises   Exercises Knee/Hip      Knee/Hip Exercises: Stretches   Active Hamstring Stretch 3 reps;30 seconds;Both      Knee/Hip Exercises: Seated   Other Seated Knee/Hip Exercises self massage with rolling stick - 4 minutes                  PT Education - 08/17/20 1604    Education Details on current presentation, HEP and focus moving forward    Person(s) Educated Patient    Methods Explanation    Comprehension Verbalized understanding            PT Short Term Goals - 08/17/20 1612      PT SHORT TERM GOAL #1   Title Patient will be independent in self management strategies to improve quality of life and functional outcomes.    Time 3    Period Weeks    Status New    Target Date 09/07/20      PT SHORT TERM GOAL #2   Title Patient will report at least 25% improvement in overall symptoms and/or function to demonstrate improved functional mobility    Time 3    Period Weeks    Status New    Target Date 09/07/20      PT SHORT TERM GOAL #3   Title Patient will not have pain with MMT in legs    Time 3    Period Weeks    Status New    Target Date 09/07/20             PT Long Term Goals - 08/17/20 1612      PT LONG TERM GOAL #1   Title Patient will report at least 50% improvement in overall symptoms and/or function to demonstrate improved functional mobility    Time 6    Period Weeks    Status New    Target Date 09/28/20      PT LONG TERM GOAL #2   Title Patient will be able to transtition from sitting to standing (after sitting for a long  time without pain in her knee.    Time 6    Period Weeks    Status New    Target Date 09/28/20      PT LONG TERM GOAL #3   Title Patient will improve on FOTO score to meet predicted outcomes to demonstrate improved functional mobility.                  Plan - 08/17/20 1615    Personal Factors  and Comorbidities Comorbidity 1    Comorbidities HTN    Examination-Activity Limitations Transfers;Locomotion Level;Stairs    Examination-Participation Restrictions Community Activity;Cleaning    Stability/Clinical Decision Making Stable/Uncomplicated    Clinical Decision Making Low    Rehab Potential Good    PT Frequency 1x / week    PT Duration 6 weeks    PT Treatment/Interventions Aquatic Therapy;Cryotherapy;Electrical Stimulation;Balance training;Moist Heat;Therapeutic exercise;Therapeutic activities;Functional mobility training;Stair training;Gait training;Neuromuscular re-education;Patient/family education;Manual techniques;Passive range of motion    PT Next Visit Plan gastroc/hamstring and quad strengthening, MMT to hips and also hip strengthening if indicated, monitor for hyper extension    PT Home Exercise Plan heel raises, hamstring stretch, self mobilization with massage stick    Consulted and Agree with Plan of Care Patient           Patient will benefit from skilled therapeutic intervention in order to improve the following deficits and impairments:  Pain,Decreased strength,Decreased activity tolerance  Visit Diagnosis: Muscle weakness (generalized)  Chronic pain of left knee  Chronic pain of right knee     Problem List Patient Active Problem List   Diagnosis Date Noted  . GERD (gastroesophageal reflux disease) 06/14/2020  . IBS (irritable bowel syndrome) 06/14/2020    4:38 PM, 08/17/20 Jerene Pitch, DPT Physical Therapy with [facility]  [phone] office [place] [address] [place], [place], [address] Phone: [phone]   Fax:  [phone]  Name: YAZMINA PAREJA MRN: [id number] Date of Birth: 10-29-1965

## 2020-08-22 ENCOUNTER — Encounter (HOSPITAL_COMMUNITY): Payer: Self-pay

## 2020-08-22 ENCOUNTER — Ambulatory Visit (HOSPITAL_COMMUNITY): Payer: BC Managed Care – PPO

## 2020-08-22 ENCOUNTER — Other Ambulatory Visit: Payer: Self-pay

## 2020-08-22 DIAGNOSIS — M6281 Muscle weakness (generalized): Secondary | ICD-10-CM

## 2020-08-22 DIAGNOSIS — G8929 Other chronic pain: Secondary | ICD-10-CM

## 2020-08-22 DIAGNOSIS — M25561 Pain in right knee: Secondary | ICD-10-CM

## 2020-08-22 NOTE — Therapy (Addendum)
[place] [address] [place], [place], [address] Phone: [phone]   Fax:  [phone]  Physical Therapy Treatment and Discharge Note  Patient Details  Name: Carrie Evans MRN: [id number] Date of Birth: 08-20-1965 Referring Provider (PT): Curtis Sites, MD   PHYSICAL THERAPY DISCHARGE SUMMARY  Visits from Start of Care:2  Current functional level related to goals / functional outcomes: Unable to assess due to unplanned discharge   Remaining deficits: Unable to assess due to unplanned discharge   Education / Equipment: Unable to assess due to unplanned discharge Plan:                                                    Patient goals were not met. Patient is being discharged due to not returning since the last visit.  ?????     11:47 AM, 08/31/20 Jerene Pitch, DPT Physical Therapy with [facility]  [phone] office  Encounter Date: 08/22/2020   PT End of Session - 08/22/20 1753    Visit Number 2    Number of Visits 6    Date for PT Re-Evaluation 09/28/20    Authorization Type BCBS COMMPPO, VL 31 between PT/OT chiro, no auth , [employer] manages as secondary no auth or VL    Authorization - Visit Number 2    Authorization - Number of Visits 31    Progress Note Due on Visit 10    PT Start Time 1750    PT Stop Time 1830    PT Time Calculation (min) 40 min    Activity Tolerance Patient tolerated treatment well    Behavior During Therapy WFL for tasks assessed/performed           Past Medical History:  Diagnosis Date  . Hypertension     Past Surgical History:  Procedure Laterality Date  . CHOLECYSTECTOMY     late 1990s  . COLONOSCOPY WITH PROPOFOL N/A 04/03/2020   [facility]: Single tubular adenoma removed.  Next colonoscopy 7 years.  . OVARIAN CYST REMOVAL    . POLYPECTOMY  04/03/2020   Procedure: POLYPECTOMY;  Surgeon: Eloise Harman, DO;  [facility];  Service:  Endoscopy;;    There were no vitals filed for this visit.   Subjective Assessment - 08/22/20 1752    Subjective Pt stated she is feeling good today, no reports of pain currently.  Reports compliance with HEP 2x daily.    Pertinent History scoliosis    Currently in Pain? No/denies              [facility] PT Assessment - 08/22/20 0001      Assessment   Medical Diagnosis bilateral knee pain    Referring Provider (PT) Curtis Sites, MD    Next MD Visit 09/27/20      ROM / Strength   AROM / PROM / Strength Strength      Strength   Right Hip Extension 3+/5   no pain just pressure   Right Hip ABduction 4-/5   no pain just pressure   Left Hip Extension 4-/5   no pain just pressure   Left Hip ABduction 4-/5   no pain just pressure   Right Knee Flexion 4/5    Left Knee Flexion 4/5  [facility] Adult PT Treatment/Exercise - 08/22/20 0001      Exercises   Exercises Knee/Hip      Knee/Hip Exercises: Stretches   Active Hamstring Stretch 3 reps;30 seconds;Both    Active Hamstring Stretch Limitations seated      Knee/Hip Exercises: Standing   Heel Raises 2 sets;10 reps    Heel Raises Limitations cueing for hyperextension    Knee Flexion 10 reps    Terminal Knee Extension 10 reps   3" holds   Theraband Level (Terminal Knee Extension) Level 2 (Red)    Terminal Knee Extension Limitations quad activation with cueing not to hyperextend knees    Hip Abduction Stengthening;10 reps    Lateral Step Up Both;10 reps;Hand Hold: 2;Step Height: 4"      Knee/Hip Exercises: Supine   Bridges 2 sets;10 reps;Strengthening      Knee/Hip Exercises: Sidelying   Hip ABduction Both;10 reps                  PT Education - 08/22/20 1803    Education Details Reviewed goals, educated importance of HEP compliance for maximal benefits, pt reports compliance and able to verbalize/demonstrate appropraite mechanics without cueing required.    Person(s) Educated  Patient    Methods Explanation    Comprehension Verbalized understanding;Returned demonstration            PT Short Term Goals - 08/17/20 1612      PT SHORT TERM GOAL #1   Title Patient will be independent in self management strategies to improve quality of life and functional outcomes.    Time 3    Period Weeks    Status New    Target Date 09/07/20      PT SHORT TERM GOAL #2   Title Patient will report at least 25% improvement in overall symptoms and/or function to demonstrate improved functional mobility    Time 3    Period Weeks    Status New    Target Date 09/07/20      PT SHORT TERM GOAL #3   Title Patient will not have pain with MMT in legs    Time 3    Period Weeks    Status New    Target Date 09/07/20             PT Long Term Goals - 08/17/20 1612      PT LONG TERM GOAL #1   Title Patient will report at least 50% improvement in overall symptoms and/or function to demonstrate improved functional mobility    Time 6    Period Weeks    Status New    Target Date 09/28/20      PT LONG TERM GOAL #2   Title Patient will be able to transtition from sitting to standing (after sitting for a long time without pain in her knee.    Time 6    Period Weeks    Status New    Target Date 09/28/20      PT LONG TERM GOAL #3   Title Patient will improve on FOTO score to meet predicted outcomes to demonstrate improved functional mobility.                 Plan - 08/22/20 1804    Clinical Impression Statement Reviewed goals, educated importance of HEP compliance for maximal benefits, pt reports compliance and able to verbalize/demonstrate appropraite mechanics without cueing required.  MMT complete with noted weakness Bil gluteal mm.  Added bridges and  hip abduction to HEP for hip strengthening and therex focus on hip and LE strengthening.  Pt able to complete all exericses with good form and no reports of pain.    Personal Factors and Comorbidities Comorbidity 1     Comorbidities HTN    Examination-Activity Limitations Transfers;Locomotion Level;Stairs    Examination-Participation Restrictions Community Activity;Cleaning    Stability/Clinical Decision Making Stable/Uncomplicated    Clinical Decision Making Low    Rehab Potential Good    PT Frequency 1x / week    PT Duration 6 weeks    PT Treatment/Interventions Aquatic Therapy;Cryotherapy;Electrical Stimulation;Balance training;Moist Heat;Therapeutic exercise;Therapeutic activities;Functional mobility training;Stair training;Gait training;Neuromuscular re-education;Patient/family education;Manual techniques;Passive range of motion    PT Next Visit Plan LE strengthening, monitor for hyper extension.  Begin SLS, vector stance, step up training.    PT Home Exercise Plan heel raises, hamstring stretch, self mobilization with massage stick/ 4/12: bridge and sidelying hip abd    Consulted and Agree with Plan of Care Patient           Patient will benefit from skilled therapeutic intervention in order to improve the following deficits and impairments:  Pain,Decreased strength,Decreased activity tolerance  Visit Diagnosis: Muscle weakness (generalized)  Chronic pain of left knee  Chronic pain of right knee     Problem List Patient Active Problem List   Diagnosis Date Noted  . GERD (gastroesophageal reflux disease) 06/14/2020  . IBS (irritable bowel syndrome) 06/14/2020   Ihor Austin, LPTA/CLT; CBIS [phone]  Aldona Lento 08/22/2020, 6:36 PM  [place] [address] [place], [place], [address] Phone: [phone]   Fax:  [phone]  Name: Carrie Evans MRN: [id number] Date of Birth: 1965/12/21

## 2020-08-29 ENCOUNTER — Encounter (HOSPITAL_COMMUNITY): Payer: BC Managed Care – PPO

## 2020-08-31 ENCOUNTER — Telehealth (HOSPITAL_COMMUNITY): Payer: Self-pay | Admitting: Physical Therapy

## 2020-08-31 NOTE — Telephone Encounter (Signed)
Pt requested to be d/c she is much better and doesn't need to return.

## 2020-09-01 ENCOUNTER — Encounter (HOSPITAL_COMMUNITY): Payer: BC Managed Care – PPO

## 2020-09-05 ENCOUNTER — Encounter (HOSPITAL_COMMUNITY): Payer: BC Managed Care – PPO | Admitting: Physical Therapy

## 2020-09-12 ENCOUNTER — Encounter (HOSPITAL_COMMUNITY): Payer: BC Managed Care – PPO

## 2020-09-13 ENCOUNTER — Ambulatory Visit: Payer: BC Managed Care – PPO | Admitting: Gastroenterology

## 2020-09-19 ENCOUNTER — Encounter (HOSPITAL_COMMUNITY): Payer: BC Managed Care – PPO | Admitting: Physical Therapy

## 2020-09-26 ENCOUNTER — Encounter (HOSPITAL_COMMUNITY): Payer: BC Managed Care – PPO | Admitting: Physical Therapy

## 2020-11-25 ENCOUNTER — Encounter: Payer: Self-pay | Admitting: Emergency Medicine

## 2020-11-25 ENCOUNTER — Ambulatory Visit
Admission: EM | Admit: 2020-11-25 | Discharge: 2020-11-25 | Disposition: A | Discharge status: Home / Self Care | Payer: BC Managed Care – PPO | Attending: Emergency Medicine | Admitting: Emergency Medicine

## 2020-11-25 ENCOUNTER — Other Ambulatory Visit: Payer: Self-pay

## 2020-11-25 DIAGNOSIS — M542 Cervicalgia: Secondary | ICD-10-CM

## 2020-11-25 DIAGNOSIS — S46812A Strain of other muscles, fascia and tendons at shoulder and upper arm level, left arm, initial encounter: Secondary | ICD-10-CM

## 2020-11-25 MED ORDER — CYCLOBENZAPRINE HCL 10 MG PO TABS: 10.0000 mg | ORAL_TABLET | Freq: Two times a day (BID) | ORAL | 0 refills | Status: DC | PRN

## 2020-11-25 MED ORDER — PREDNISONE 20 MG PO TABS: 20.0000 mg | ORAL_TABLET | Freq: Two times a day (BID) | ORAL | 0 refills | Status: AC

## 2020-11-25 NOTE — Discharge Instructions (Addendum)
Continue conservative management of rest, ice, and gentle stretches Prescribed prednisone take as directed and to completion Take cyclobenzaprine at nighttime for symptomatic relief. Avoid driving or operating heavy machinery while using medication. Follow up with PCP if symptoms persist Return or go to the ER if you have any new or worsening symptoms (fever, chills, chest pain, redness, swelling, etc...)

## 2020-11-25 NOTE — ED Provider Notes (Signed)
[place]   [id number] 11/25/20 Arrival Time: 9326  CC: neck and shoulder PAIN  SUBJECTIVE: History from: patient. Carrie Evans is a 55 y.o. female complains of LT sided neck and shoulder pain intermittently for the past few months.  Denies a precipitating event or specific injury.  Localizes the pain to the LT shoulder and neck.  Describes the pain as intermittent and pulling in character.  Has tried OTC medications without relief.  Symptoms are made worse at night.  Denies similar symptoms in the past.  Denies fever, chills, erythema, ecchymosis, effusion, weakness, numbness and tingling.  ROS: As per HPI.  All other pertinent ROS negative.     Past Medical History:  Diagnosis Date   Hypertension    Past Surgical History:  Procedure Laterality Date   CHOLECYSTECTOMY     late 1990s   COLONOSCOPY WITH PROPOFOL N/A 04/03/2020   [facility]: Single tubular adenoma removed.  Next colonoscopy 7 years.   OVARIAN CYST REMOVAL     POLYPECTOMY  04/03/2020   Procedure: POLYPECTOMY;  Surgeon: Eloise Harman, DO;  [facility];  Service: Endoscopy;;   Allergies  Allergen Reactions   Codeine Nausea And Vomiting    Abdominal Cramping   No current facility-administered medications on file prior to encounter.   Current Outpatient Medications on File Prior to Encounter  Medication Sig Dispense Refill   amLODipine (NORVASC) 5 MG tablet Take 5 mg by mouth daily.   1   azelastine (ASTELIN) 0.1 % nasal spray Place 1 spray into both nostrils 2 (two) times daily.     Black Cohosh 540 MG CAPS Take by mouth daily.     cetirizine (ZYRTEC) 10 MG tablet Take 10 mg by mouth daily.     cholecalciferol (VITAMIN D3) 25 MCG (1000 UNIT) tablet Take 1,000 Units by mouth daily.     dicyclomine (BENTYL) 10 MG capsule Take one capsule up to four times daily for abdominal cramping and frequent/loose stool. 270 capsule 1   hydrochlorothiazide (HYDRODIURIL) 12.5 MG tablet Take 12.5 mg by mouth  daily.   2   lactobacillus acidophilus (BACID) TABS tablet Take 1 tablet by mouth daily.     Omega-3 Fatty Acids (FISH OIL) 1200 MG CAPS Take 1,576 mg by mouth daily.     pantoprazole (PROTONIX) 20 MG tablet Take 20 mg by mouth daily.     potassium chloride SA (KLOR-CON) 20 MEQ tablet Take 20 mEq by mouth daily.     [DISCONTINUED] esomeprazole (NEXIUM) 40 MG capsule Take 40 mg by mouth daily at 12 noon.     Social History   Socioeconomic History   Marital status: Married    Spouse name: Not on file   Number of children: Not on file   Years of education: Not on file   Highest education level: Not on file  Occupational History   Not on file  Tobacco Use   Smoking status: Never   Smokeless tobacco: Never  Vaping Use   Vaping Use: Never used  Substance and Sexual Activity   Alcohol use: No   Drug use: No   Sexual activity: Not on file  Other Topics Concern   Not on file  Social History Narrative   Not on file   Social Determinants of Health   Financial Resource Strain: Not on file  Food Insecurity: Not on file  Transportation Needs: Not on file  Physical Activity: Not on file  Stress: Not on file  Social Connections:  Not on file  Intimate Partner Violence: Not on file   Family History  Problem Relation Age of Onset   Breast cancer Cousin        Mother's niece   Ulcerative colitis Sister    Colon cancer Neg Hx     OBJECTIVE:  Vitals:   11/25/20 0841  BP: 132/85  Pulse: 71  Resp: 19  Temp: 98.6 F (37 C)  TempSrc: Oral  SpO2: 98%    General appearance: ALERT; in no acute distress.  Head: NCAT Lungs: Normal respiratory effort CV: Radial pulse 2+ Musculoskeletal: LT shoulder/ neck Inspection: Skin warm, dry, clear and intact without obvious erythema, effusion, or ecchymosis.  Palpation: diffusely TTP over posterior shoulder ROM: FROM active and passive Strength: 5/5 shld abduction, 5/5 shld adduction, 5/5 elbow flexion, 5/5 elbow extension, 5/5 grip  strength Skin: warm and dry Neurologic: Ambulates without difficulty; Sensation intact about the upper/ lower extremities Psychological: alert and cooperative; normal mood and affect   ASSESSMENT & PLAN:  1. Neck pain on left side   2. Trapezius strain, left, initial encounter     Meds ordered this encounter  Medications   predniSONE (DELTASONE) 20 MG tablet    Sig: Take 1 tablet (20 mg total) by mouth 2 (two) times daily with a meal for 5 days.    Dispense:  10 tablet    Refill:  0    Order Specific Question:   Supervising Provider    Answer:   Raylene Everts [[id number]]   cyclobenzaprine (FLEXERIL) 10 MG tablet    Sig: Take 1 tablet (10 mg total) by mouth 2 (two) times daily as needed for muscle spasms.    Dispense:  20 tablet    Refill:  0    Order Specific Question:   Supervising Provider    Answer:   Raylene Everts [[id number]]    Continue conservative management of rest, ice, and gentle stretches Prescribed prednisone take as directed and to completion Take cyclobenzaprine at nighttime for symptomatic relief. Avoid driving or operating heavy machinery while using medication. Follow up with PCP if symptoms persist Return or go to the ER if you have any new or worsening symptoms (fever, chills, chest pain, redness, swelling, etc...)    Reviewed expectations re: course of current medical issues. Questions answered. Outlined signs and symptoms indicating need for more acute intervention. Patient verbalized understanding. After Visit Summary given.     Lestine Box, PA-C 11/25/20 [phone]

## 2020-11-25 NOTE — ED Triage Notes (Signed)
Pain to LT shoulder with movement.  Denies any injury

## 2020-12-13 ENCOUNTER — Ambulatory Visit: Payer: BC Managed Care – PPO | Admitting: Gastroenterology

## 2020-12-19 ENCOUNTER — Other Ambulatory Visit (HOSPITAL_COMMUNITY): Payer: Self-pay | Admitting: Family Medicine

## 2020-12-19 DIAGNOSIS — Z1231 Encounter for screening mammogram for malignant neoplasm of breast: Secondary | ICD-10-CM

## 2021-03-05 ENCOUNTER — Ambulatory Visit (HOSPITAL_COMMUNITY)
Admission: RE | Admit: 2021-03-05 | Discharge: 2021-03-05 | Disposition: A | Discharge status: Home / Self Care | Payer: BC Managed Care – PPO | Source: Ambulatory Visit | Attending: Family Medicine | Admitting: Family Medicine

## 2021-03-05 ENCOUNTER — Other Ambulatory Visit: Payer: Self-pay

## 2021-03-05 DIAGNOSIS — Z1231 Encounter for screening mammogram for malignant neoplasm of breast: Secondary | ICD-10-CM | POA: Diagnosis present

## 2021-05-22 ENCOUNTER — Ambulatory Visit: Payer: Self-pay

## 2022-02-05 ENCOUNTER — Other Ambulatory Visit (HOSPITAL_COMMUNITY): Payer: Self-pay | Admitting: Family Medicine

## 2022-02-05 DIAGNOSIS — Z1231 Encounter for screening mammogram for malignant neoplasm of breast: Secondary | ICD-10-CM

## 2022-03-07 ENCOUNTER — Ambulatory Visit (HOSPITAL_COMMUNITY): Payer: BC Managed Care – PPO

## 2022-03-11 ENCOUNTER — Encounter (HOSPITAL_COMMUNITY): Payer: Self-pay

## 2022-03-11 ENCOUNTER — Ambulatory Visit (HOSPITAL_COMMUNITY)
Admission: RE | Admit: 2022-03-11 | Discharge: 2022-03-11 | Disposition: A | Discharge status: Home / Self Care | Payer: BC Managed Care – PPO | Source: Ambulatory Visit | Attending: Family Medicine | Admitting: Family Medicine

## 2022-03-11 DIAGNOSIS — Z1231 Encounter for screening mammogram for malignant neoplasm of breast: Secondary | ICD-10-CM | POA: Insufficient documentation

## 2023-01-27 ENCOUNTER — Other Ambulatory Visit (HOSPITAL_COMMUNITY): Payer: Self-pay | Admitting: Internal Medicine

## 2023-01-27 DIAGNOSIS — Z1231 Encounter for screening mammogram for malignant neoplasm of breast: Secondary | ICD-10-CM

## 2023-03-17 ENCOUNTER — Encounter (HOSPITAL_COMMUNITY): Payer: Self-pay

## 2023-03-17 ENCOUNTER — Ambulatory Visit (HOSPITAL_COMMUNITY)
Admission: RE | Admit: 2023-03-17 | Discharge: 2023-03-17 | Disposition: A | Discharge status: Home / Self Care | Payer: BC Managed Care – PPO | Source: Ambulatory Visit | Attending: Internal Medicine | Admitting: Internal Medicine

## 2023-03-17 DIAGNOSIS — Z1231 Encounter for screening mammogram for malignant neoplasm of breast: Secondary | ICD-10-CM | POA: Diagnosis present

## 2023-05-28 IMAGING — MG MM DIGITAL SCREENING BILAT W/ TOMO AND CAD
8 series · 8 of 24 positions shown · non-contrast
Comparison: Previous exam(s).

CLINICAL DATA: Screening.

EXAM:
DIGITAL SCREENING BILATERAL MAMMOGRAM WITH TOMOSYNTHESIS AND CAD
TECHNIQUE: Bilateral screening digital craniocaudal and mediolateral oblique
mammograms were obtained. Bilateral screening digital breast
tomosynthesis was performed. The images were evaluated with
computer-aided detection.

[L CC synth-2D]
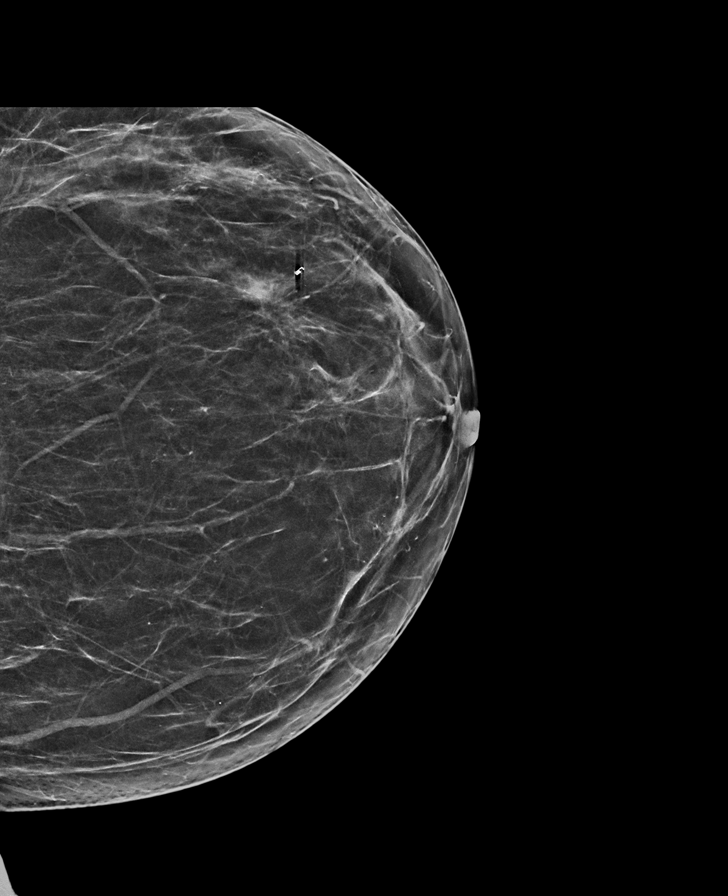

[R MLO synth-2D]
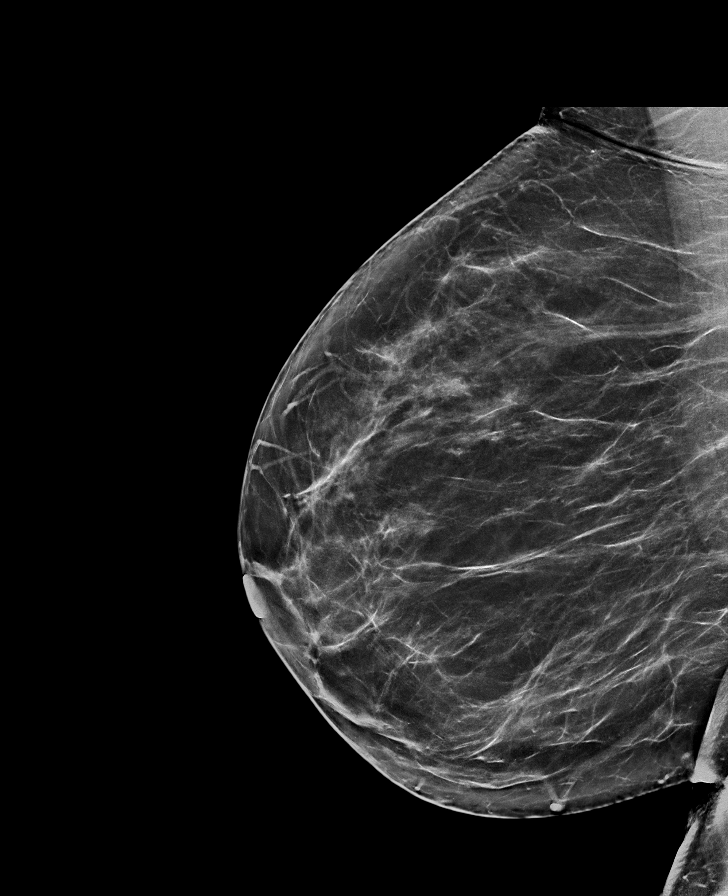

[R CC synth-2D]
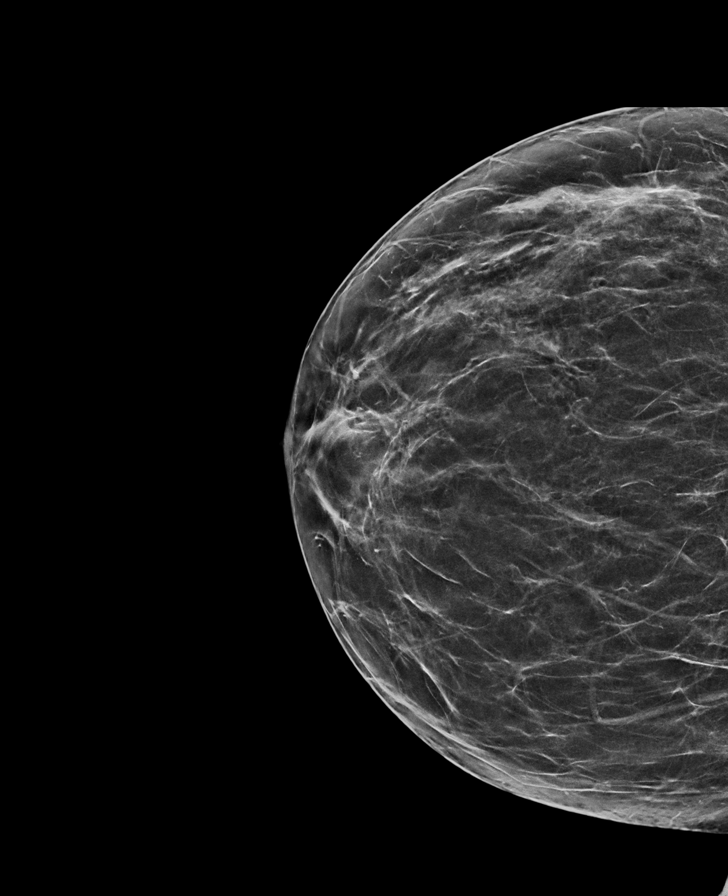

[L MLO synth-2D]
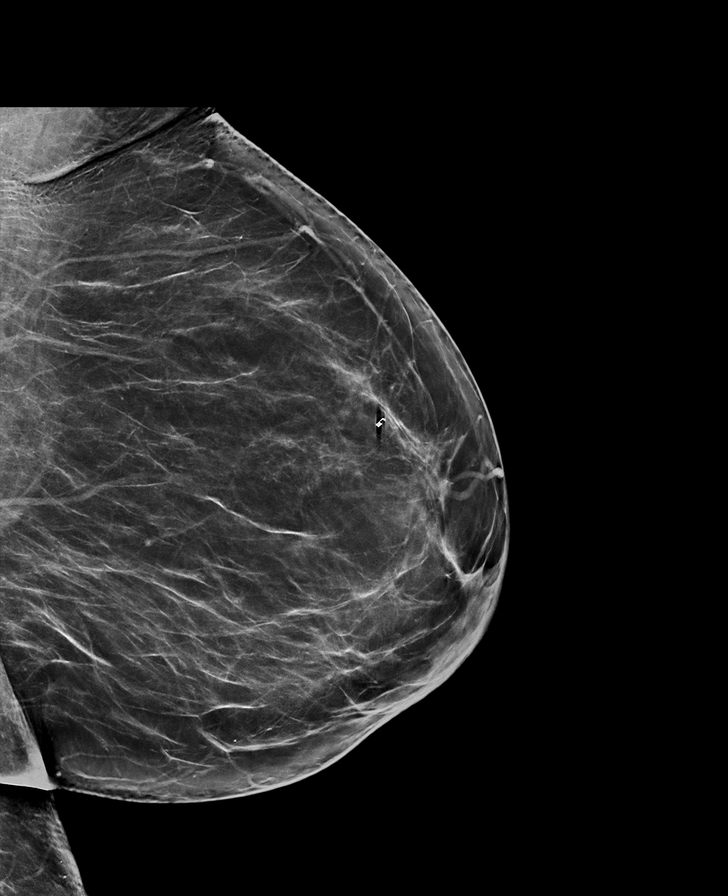

[L CC tomo · tomo slice 33/65.0]
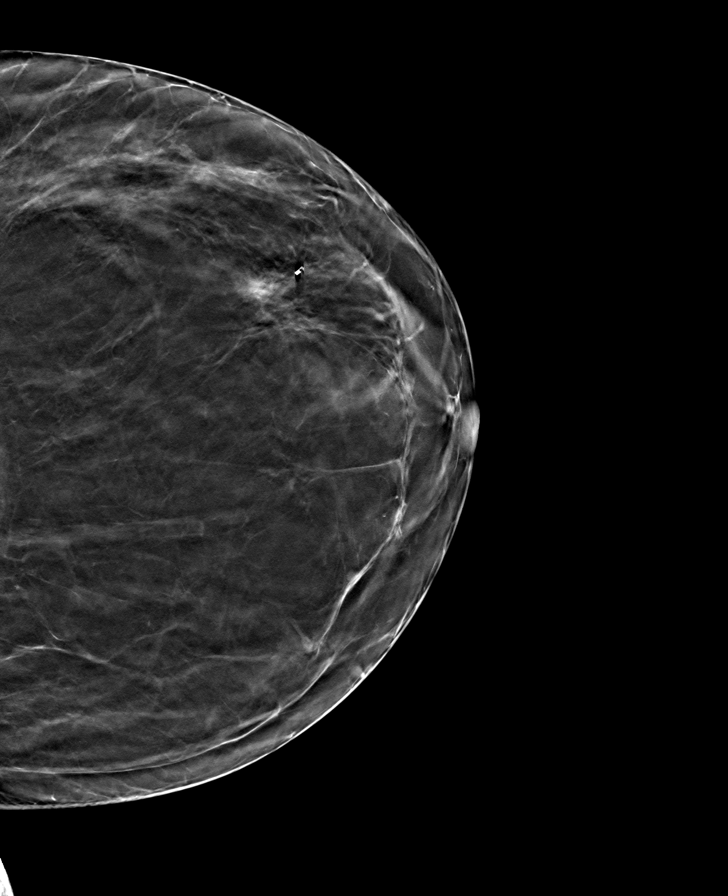

[L MLO tomo · tomo slice 41/80.0]
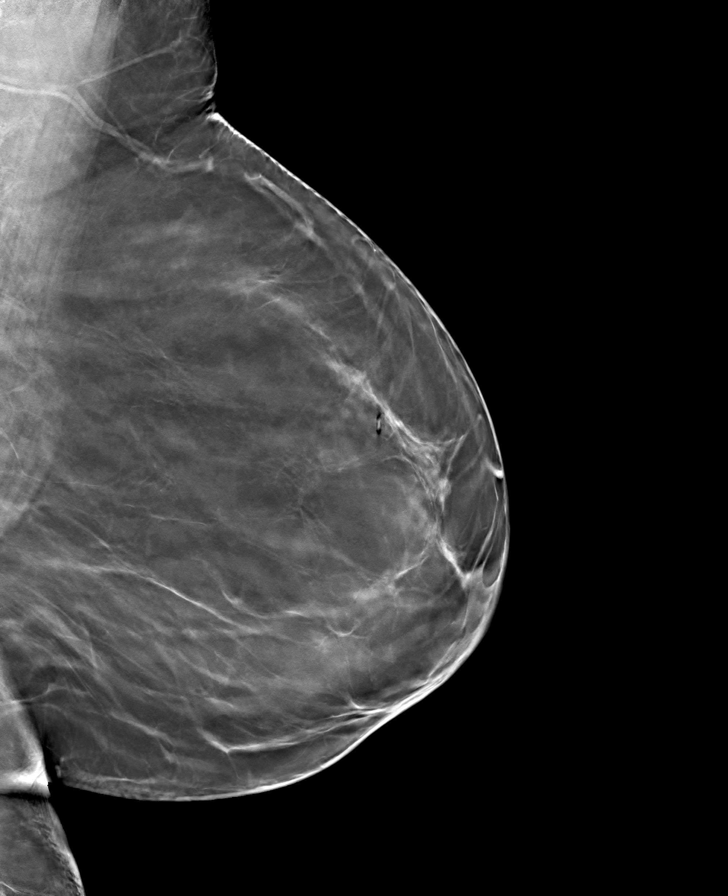

[R MLO tomo · tomo slice 39/78.0]
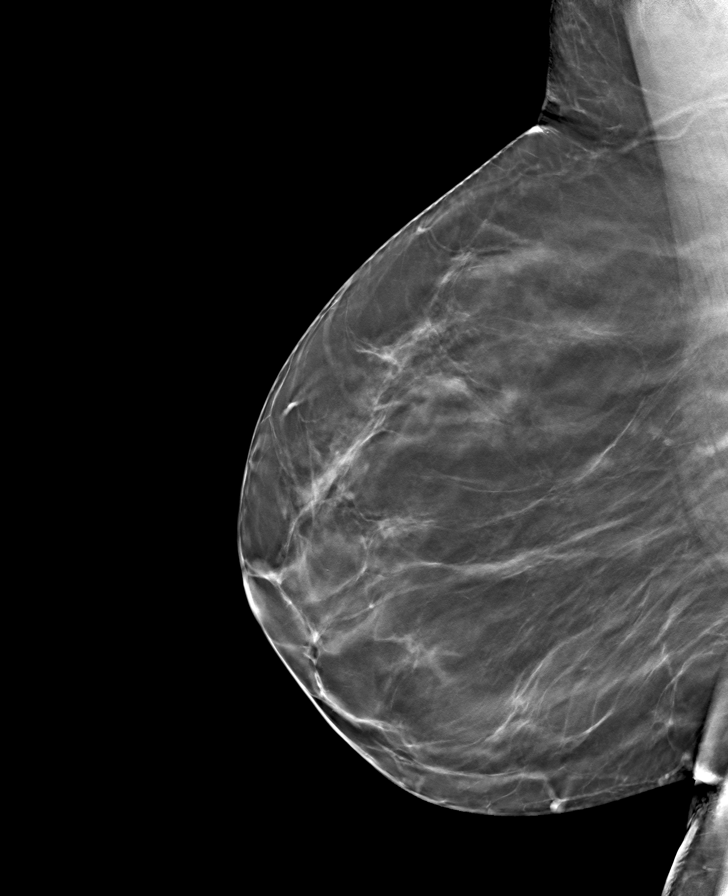

[R CC tomo · tomo slice 33/66.0]
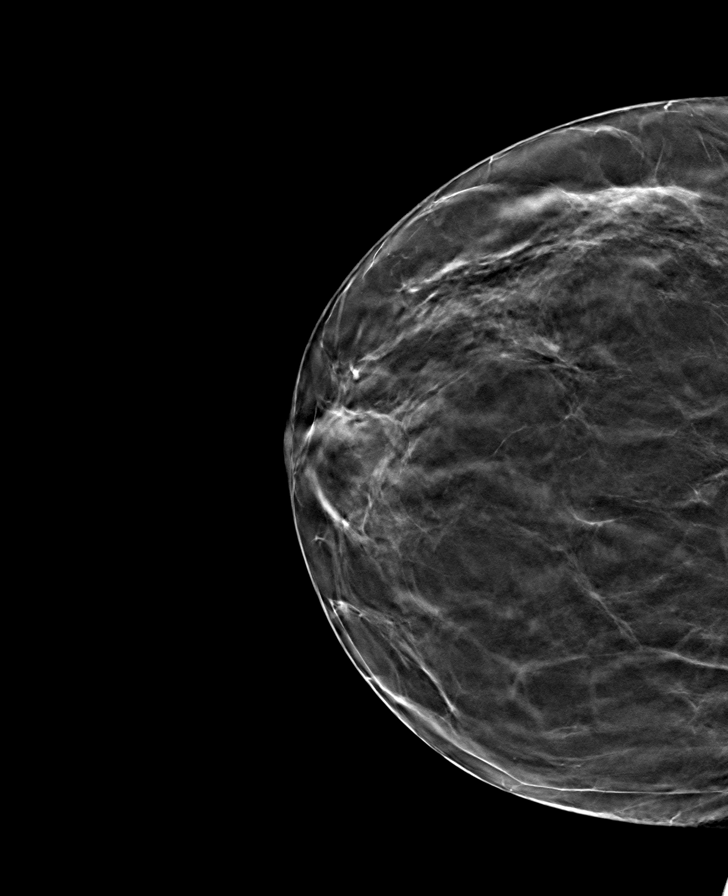

[8 of 24 positions shown; findings below may reference images not displayed]

ACR Breast Density Category b: There are scattered areas of
fibroglandular density.
FINDINGS: There are no findings suspicious for malignancy.
IMPRESSION: No mammographic evidence of malignancy. A result letter of this
screening mammogram will be mailed directly to the patient.

RECOMMENDATION:
Screening mammogram in one year. (Code:51-O-LD2)

BI-RADS CATEGORY  1: Negative.

## 2024-02-16 ENCOUNTER — Other Ambulatory Visit (HOSPITAL_COMMUNITY): Payer: Self-pay | Admitting: Obstetrics and Gynecology

## 2024-02-16 DIAGNOSIS — Z1231 Encounter for screening mammogram for malignant neoplasm of breast: Secondary | ICD-10-CM

## 2024-02-24 ENCOUNTER — Ambulatory Visit: Admitting: Obstetrics and Gynecology

## 2024-02-24 ENCOUNTER — Encounter: Payer: Self-pay | Admitting: Obstetrics and Gynecology

## 2024-02-24 ENCOUNTER — Other Ambulatory Visit (HOSPITAL_COMMUNITY)
Admission: RE | Admit: 2024-02-24 | Discharge: 2024-02-24 | Disposition: A | Source: Ambulatory Visit | Attending: Obstetrics and Gynecology | Admitting: Obstetrics and Gynecology

## 2024-02-24 ENCOUNTER — Ambulatory Visit: Payer: Self-pay | Admitting: Obstetrics and Gynecology

## 2024-02-24 VITALS — BP 122/64 | HR 71 | Temp 97.9°F | Ht 67.25 in | Wt 232.0 lb

## 2024-02-24 DIAGNOSIS — Z124 Encounter for screening for malignant neoplasm of cervix: Secondary | ICD-10-CM | POA: Insufficient documentation

## 2024-02-24 DIAGNOSIS — N950 Postmenopausal bleeding: Secondary | ICD-10-CM | POA: Insufficient documentation

## 2024-02-24 DIAGNOSIS — Z1331 Encounter for screening for depression: Secondary | ICD-10-CM | POA: Diagnosis not present

## 2024-02-24 DIAGNOSIS — Z01419 Encounter for gynecological examination (general) (routine) without abnormal findings: Secondary | ICD-10-CM | POA: Diagnosis not present

## 2024-02-24 LAB — URINALYSIS, COMPLETE W/RFL CULTURE
Bacteria, UA: NONE SEEN /HPF
Bilirubin Urine: NEGATIVE
Glucose, UA: NEGATIVE
Hgb urine dipstick: NEGATIVE
Hyaline Cast: NONE SEEN /LPF
Ketones, ur: NEGATIVE
Leukocyte Esterase: NEGATIVE
Nitrites, Initial: NEGATIVE
Protein, ur: NEGATIVE
RBC / HPF: NONE SEEN /HPF (ref 0–2)
Specific Gravity, Urine: 1.015 (ref 1.001–1.035)
WBC, UA: NONE SEEN /HPF (ref 0–5)
pH: 7 (ref 5.0–8.0)

## 2024-02-24 LAB — NO CULTURE INDICATED

## 2024-02-24 NOTE — Assessment & Plan Note (Signed)
 Cervical cancer screening performed according to ASCCP guidelines. Encouraged annual mammogram screening Colonoscopy UTD DXA UTD, 2024, early screen by PCP due to PPI use Labs and immunizations with her primary Encouraged safe sexual practices as indicated Encouraged healthy lifestyle practices with diet and exercise For patients under 50-57yo, I recommend 1200mg  calcium daily and 600IU of vitamin D daily.

## 2024-02-24 NOTE — Progress Notes (Signed)
 58 y.o. G25P0101 female here for new patient annual exam. Married. 1 daughter, 3 grandkids. Works at [employer].  PCP: Janace Stephens RAMAN, MD   Patient's last menstrual period was 12/18/2010.   She noted very little spotting ~2 weeks ago and some back pain. No hx of recent UTI or kidney stones. Urine sample provided: Yes  Abnormal bleeding: as noted Pelvic discharge or pain: none Breast mass, nipple discharge or skin changes : none  Sexually active: Yes Birth control: none Last PAP: No results found for: DIAGPAP, HPVHIGH, ADEQPAP Last mammogram: 03/17/23, BIRADS 1, density b, scheduled 04/21/24  Last colonoscopy: 04/03/20 7 yr recall  Exercising: not currently Smoker: No  Flowsheet Row Office Visit from 02/24/2024 in [facility]  PHQ-2 Total Score 0      GYN HISTORY: No sig hx  OB History  Gravida Para Term Preterm AB Living  1 1  1  1   SAB IAB Ectopic Multiple Live Births      1    # Outcome Date GA Lbr Len/2nd Weight Sex Type Anes PTL Lv  1 Preterm     F Vag-Spont   LIV   Past Medical History:  Diagnosis Date   Hypertension    Past Surgical History:  Procedure Laterality Date   BREAST BIOPSY Left 2021   USUAL DUCTAL HYPERPLASIA, ADENOSIS AND FIBROCYSTIC CHANGES   CHOLECYSTECTOMY     late 1990s   COLONOSCOPY WITH PROPOFOL  N/A 04/03/2020   [facility]ver: Single tubular adenoma removed.  Next colonoscopy 7 years.   OVARIAN CYST REMOVAL     POLYPECTOMY  04/03/2020   Procedure: POLYPECTOMY;  Surgeon: Cindie Carlin POUR, DO;  Location: [facility];  Service: Endoscopy;;   Current Outpatient Medications on File Prior to Visit  Medication Sig Dispense Refill   amLODipine (NORVASC) 5 MG tablet Take 5 mg by mouth daily.  (Patient taking differently: Take 10 mg by mouth daily.)  1   atorvastatin (LIPITOR) 20 MG tablet Take 20 mg by mouth daily.     Black Cohosh 540 MG CAPS Take by mouth daily.     cetirizine (ZYRTEC) 10 MG  tablet Take 10 mg by mouth daily.     cholecalciferol (VITAMIN D3) 25 MCG (1000 UNIT) tablet Take 1,000 Units by mouth daily.     hydrochlorothiazide (HYDRODIURIL) 12.5 MG tablet Take 12.5 mg by mouth daily.  (Patient taking differently: Take 25 mg by mouth daily.)  2   pantoprazole (PROTONIX) 20 MG tablet Take 20 mg by mouth daily.     potassium chloride SA (KLOR-CON) 20 MEQ tablet Take 20 mEq by mouth daily.     sucralfate (CARAFATE) 1 g tablet Take 1 g by mouth as needed.     No current facility-administered medications on file prior to visit.   Social History   Socioeconomic History   Marital status: Married    Spouse name: Not on file   Number of children: Not on file   Years of education: Not on file   Highest education level: Not on file  Occupational History   Not on file  Tobacco Use   Smoking status: Never   Smokeless tobacco: Never  Vaping Use   Vaping status: Never Used  Substance and Sexual Activity   Alcohol use: No   Drug use: No   Sexual activity: Yes    Birth control/protection: None  Other Topics Concern   Not on file  Social History Narrative   Not  on file   Social Drivers of Health   Financial Resource Strain: Not on file  Food Insecurity: Not on file  Transportation Needs: Not on file  Physical Activity: Not on file  Stress: Not on file  Social Connections: Not on file  Intimate Partner Violence: Not on file   Family History  Problem Relation Age of Onset   Breast cancer Cousin        Mother's niece   Ulcerative colitis Sister    Colon cancer Neg Hx    Allergies  Allergen Reactions   Codeine Nausea And Vomiting    Abdominal Cramping     PE Today's Vitals   02/24/24 1459  BP: 122/64  Pulse: 71  Temp: 97.9 F (36.6 C)  TempSrc: Oral  SpO2: 98%  Weight: 232 lb (105.2 kg)  Height: 5' 7.25 (1.708 m)   Body mass index is 36.07 kg/m.  Physical Exam Vitals reviewed. Exam conducted with a chaperone present.  Constitutional:       General: She is not in acute distress.    Appearance: Normal appearance.  HENT:     Head: Normocephalic and atraumatic.     Nose: Nose normal.  Eyes:     Extraocular Movements: Extraocular movements intact.     Conjunctiva/sclera: Conjunctivae normal.  Neck:     Thyroid: No thyroid mass, thyromegaly or thyroid tenderness.  Pulmonary:     Effort: Pulmonary effort is normal.  Chest:     Chest wall: No mass or tenderness.  Breasts:    Right: Normal. No swelling, mass, nipple discharge, skin change or tenderness.     Left: Normal. No swelling, mass, nipple discharge, skin change or tenderness.  Abdominal:     General: There is no distension.     Palpations: Abdomen is soft.     Tenderness: There is no abdominal tenderness.  Genitourinary:    General: Normal vulva.     Exam position: Lithotomy position.     Urethra: No prolapse.     Vagina: Normal. No vaginal discharge or bleeding.     Cervix: Normal. No lesion.     Uterus: Normal. Not enlarged and not tender.      Adnexa: Right adnexa normal and left adnexa normal.  Musculoskeletal:        General: Normal range of motion.     Cervical back: Normal range of motion.  Lymphadenopathy:     Upper Body:     Right upper body: No axillary adenopathy.     Left upper body: No axillary adenopathy.     Lower Body: No right inguinal adenopathy. No left inguinal adenopathy.  Skin:    General: Skin is warm and dry.  Neurological:     General: No focal deficit present.     Mental Status: She is alert.  Psychiatric:        Mood and Affect: Mood normal.        Behavior: Behavior normal.      Assessment and Plan:        Well woman exam with routine gynecological exam Assessment & Plan: Cervical cancer screening performed according to ASCCP guidelines. Encouraged annual mammogram screening Colonoscopy UTD DXA UTD, 2024, early screen by PCP due to PPI use Labs and immunizations with her primary Encouraged safe sexual practices as  indicated Encouraged healthy lifestyle practices with diet and exercise For patients under 50-70yo, I recommend 1200mg  calcium daily and 600IU of vitamin D daily.    Cervical cancer screening -  Cytology - PAP  Screening for depression  PMB (postmenopausal bleeding) -     US  PELVIS TRANSVAGINAL NON-OB (TV ONLY); Future -     Endometrial biopsy; Future -     Urinalysis,Complete w/RFL Culture   Vera LULLA Pa, MD

## 2024-02-24 NOTE — Patient Instructions (Signed)
 For patients under 50-57yo, I recommend 1200mg  calcium  daily and 600IU of vitamin D daily. For patients over 57yo, I recommend 1200mg  calcium  daily and 800IU of vitamin D daily.  Health Maintenance, Female Adopting a healthy lifestyle and getting preventive care are important in promoting health and wellness. Ask your health care provider about: The right schedule for you to have regular tests and exams. Things you can do on your own to prevent diseases and keep yourself healthy. What should I know about diet, weight, and exercise? Eat a healthy diet  Eat a diet that includes plenty of vegetables, fruits, low-fat dairy products, and lean protein. Do not eat a lot of foods that are high in solid fats, added sugars, or sodium. Maintain a healthy weight Body mass index (BMI) is used to identify weight problems. It estimates body fat based on height and weight. Your health care provider can help determine your BMI and help you achieve or maintain a healthy weight. Get regular exercise Get regular exercise. This is one of the most important things you can do for your health. Most adults should: Exercise for at least 150 minutes each week. The exercise should increase your heart rate and make you sweat (moderate-intensity exercise). Do strengthening exercises at least twice a week. This is in addition to the moderate-intensity exercise. Spend less time sitting. Even light physical activity can be beneficial. Watch cholesterol and blood lipids Have your blood tested for lipids and cholesterol at 57 years of age, then have this test every 5 years. Have your cholesterol levels checked more often if: Your lipid or cholesterol levels are high. You are older than 57 years of age. You are at high risk for heart disease. What should I know about cancer screening? Depending on your health history and family history, you may need to have cancer screening at various ages. This may include screening  for: Breast cancer. Cervical cancer. Colorectal cancer. Skin cancer. Lung cancer. What should I know about heart disease, diabetes, and high blood pressure? Blood pressure and heart disease High blood pressure causes heart disease and increases the risk of stroke. This is more likely to develop in people who have high blood pressure readings or are overweight. Have your blood pressure checked: Every 3-5 years if you are 25-57 years of age. Every year if you are 24 years old or older. Diabetes Have regular diabetes screenings. This checks your fasting blood sugar level. Have the screening done: Once every three years after age 62 if you are at a normal weight and have a low risk for diabetes. More often and at a younger age if you are overweight or have a high risk for diabetes. What should I know about preventing infection? Hepatitis B If you have a higher risk for hepatitis B, you should be screened for this virus. Talk with your health care provider to find out if you are at risk for hepatitis B infection. Hepatitis C Testing is recommended for: Everyone born from [birth year] through 1965. Anyone with known risk factors for hepatitis C. Sexually transmitted infections (STIs) Get screened for STIs, including gonorrhea and chlamydia, if: You are sexually active and are younger than 57 years of age. You are older than 57 years of age and your health care provider tells you that you are at risk for this type of infection. Your sexual activity has changed since you were last screened, and you are at increased risk for chlamydia or gonorrhea. Ask your health care provider if  you are at risk. Ask your health care provider about whether you are at high risk for HIV. Your health care provider may recommend a prescription medicine to help prevent HIV infection. If you choose to take medicine to prevent HIV, you should first get tested for HIV. You should then be tested every 3 months for as long as you  are taking the medicine. Osteoporosis and menopause Osteoporosis is a disease in which the bones lose minerals and strength with aging. This can result in bone fractures. If you are 72 years old or older, or if you are at risk for osteoporosis and fractures, ask your health care provider if you should: Be screened for bone loss. Take a calcium  or vitamin D supplement to lower your risk of fractures. Be given hormone replacement therapy (HRT) to treat symptoms of menopause. Follow these instructions at home: Alcohol use Do not drink alcohol if: Your health care provider tells you not to drink. You are pregnant, may be pregnant, or are planning to become pregnant. If you drink alcohol: Limit how much you have to: 0-1 drink a day. Know how much alcohol is in your drink. In the [place] one drink equals one 12 oz bottle of beer (355 mL), one 5 oz glass of wine (148 mL), or one 1 oz glass of hard liquor (44 mL). Lifestyle Do not use any products that contain nicotine or tobacco. These products include cigarettes, chewing tobacco, and vaping devices, such as e-cigarettes. If you need help quitting, ask your health care provider. Do not use street drugs. Do not share needles. Ask your health care provider for help if you need support or information about quitting drugs. General instructions Schedule regular health, dental, and eye exams. Stay current with your vaccines. Tell your health care provider if: You often feel depressed. You have ever been abused or do not feel safe at home. Summary Adopting a healthy lifestyle and getting preventive care are important in promoting health and wellness. Follow your health care provider's instructions about healthy diet, exercising, and getting tested or screened for diseases. Follow your health care provider's instructions on monitoring your cholesterol and blood pressure. This information is not intended to replace advice given to you by your health  care provider. Make sure you discuss any questions you have with your health care provider. Document Revised: 09/18/2020 Document Reviewed: 09/18/2020 Elsevier Patient Education  2024 [employer].

## 2024-02-27 LAB — CYTOLOGY - PAP
Adequacy: ABSENT
Comment: NEGATIVE
Diagnosis: NEGATIVE
Diagnosis: REACTIVE
High risk HPV: NEGATIVE

## 2024-03-22 ENCOUNTER — Encounter (HOSPITAL_COMMUNITY): Payer: Self-pay

## 2024-03-22 ENCOUNTER — Ambulatory Visit (HOSPITAL_COMMUNITY)
Admission: RE | Admit: 2024-03-22 | Discharge: 2024-03-22 | Disposition: A | Discharge status: Home / Self Care | Source: Ambulatory Visit | Attending: Obstetrics and Gynecology | Admitting: Obstetrics and Gynecology

## 2024-03-22 DIAGNOSIS — Z1231 Encounter for screening mammogram for malignant neoplasm of breast: Secondary | ICD-10-CM | POA: Insufficient documentation

## 2024-03-23 ENCOUNTER — Encounter: Payer: Self-pay | Admitting: Obstetrics and Gynecology

## 2024-03-23 ENCOUNTER — Ambulatory Visit (INDEPENDENT_AMBULATORY_CARE_PROVIDER_SITE_OTHER): Admitting: Obstetrics and Gynecology

## 2024-03-23 ENCOUNTER — Other Ambulatory Visit (HOSPITAL_COMMUNITY)
Admission: RE | Admit: 2024-03-23 | Discharge: 2024-03-23 | Disposition: A | Discharge status: Home / Self Care | Source: Ambulatory Visit | Attending: Obstetrics and Gynecology | Admitting: Obstetrics and Gynecology

## 2024-03-23 ENCOUNTER — Other Ambulatory Visit: Payer: Self-pay | Admitting: Obstetrics and Gynecology

## 2024-03-23 ENCOUNTER — Ambulatory Visit (INDEPENDENT_AMBULATORY_CARE_PROVIDER_SITE_OTHER)

## 2024-03-23 VITALS — BP 122/72 | HR 82 | Temp 98.2°F | Ht 67.0 in | Wt 229.0 lb

## 2024-03-23 DIAGNOSIS — Z01419 Encounter for gynecological examination (general) (routine) without abnormal findings: Secondary | ICD-10-CM

## 2024-03-23 DIAGNOSIS — Z124 Encounter for screening for malignant neoplasm of cervix: Secondary | ICD-10-CM

## 2024-03-23 DIAGNOSIS — N950 Postmenopausal bleeding: Secondary | ICD-10-CM | POA: Diagnosis not present

## 2024-03-23 DIAGNOSIS — R9389 Abnormal findings on diagnostic imaging of other specified body structures: Secondary | ICD-10-CM | POA: Insufficient documentation

## 2024-03-23 DIAGNOSIS — Z1331 Encounter for screening for depression: Secondary | ICD-10-CM

## 2024-03-23 DIAGNOSIS — D251 Intramural leiomyoma of uterus: Secondary | ICD-10-CM | POA: Diagnosis not present

## 2024-03-23 DIAGNOSIS — N9489 Other specified conditions associated with female genital organs and menstrual cycle: Secondary | ICD-10-CM | POA: Insufficient documentation

## 2024-03-23 DIAGNOSIS — D252 Subserosal leiomyoma of uterus: Secondary | ICD-10-CM

## 2024-03-23 DIAGNOSIS — D250 Submucous leiomyoma of uterus: Secondary | ICD-10-CM | POA: Insufficient documentation

## 2024-03-23 NOTE — Assessment & Plan Note (Addendum)
 Reviewed TVUS with patient revealing thickened endometrium, multiple uterine fibroids (the largest measuring 9cm), and 12cm multilocular adnexal mass.  PMB& thickened endometrium: recommend EMB- consented and performed. Adnexal mass: recommend tumor markers and GYN ONC referral for >10cm multiloculated adnexal mass.  All questions answered

## 2024-03-23 NOTE — Patient Instructions (Signed)
 It is common to have vaginal bleeding and cramping for up to 72 hours after your biopsy. Please call our office with heavy vaginal bleeding, severe abdominal pain or fever. Avoid intercourse, tampon use, douching and baths for 7 days to decrease the risk of infection.

## 2024-03-23 NOTE — Assessment & Plan Note (Signed)
 Recommended EMB, uncx F/u pathology

## 2024-03-23 NOTE — Assessment & Plan Note (Addendum)
 Discussed pathology of fibroids and benign nature. Reviewed that management of fibroids is dependent upon associated symptoms. Asymptomatic fibroids can be managed expectantly. However, fibroids can cause AUB and bulk symptoms, including dysmenorrhea, pelvic and lower back pain and pressure, dyspareunia, and constipation.  Patient is currently asymptomatic. Treatment pending pathology and TM results.

## 2024-03-23 NOTE — Progress Notes (Signed)
 58 y.o. G37P0101 female with PMB, hx of ovarian cystectomy here for TVUS. Married. 1 daughter, 3 grandkids. Works at [employer].  PCP: Janace Stephens RAMAN, MD   Patient's last menstrual period was 12/18/2010.   She noted very little spotting in October and some back pain.  No hx of recent UTI or kidney stones.  Sexually active: Yes Last PAP:     Component Value Date/Time   DIAGPAP  02/24/2024 1553    - Negative for Intraepithelial Lesions or Malignancy (NILM)   DIAGPAP - Benign reactive/reparative changes 02/24/2024 1553   HPVHIGH Negative 02/24/2024 1553   ADEQPAP  02/24/2024 1553    Satisfactory for evaluation; transformation zone component ABSENT.   Last mammogram: 03/17/23, BIRADS 1, density b, scheduled 04/21/24  Last colonoscopy: 04/03/20 7 yr recall     GYN HISTORY: Benign ovarian cystectomy  OB History  Gravida Para Term Preterm AB Living  1 1  1  1   SAB IAB Ectopic Multiple Live Births      1    # Outcome Date GA Lbr Len/2nd Weight Sex Type Anes PTL Lv  1 Preterm     F Vag-Spont   LIV   Past Medical History:  Diagnosis Date   Hypertension    Past Surgical History:  Procedure Laterality Date   BREAST BIOPSY Left 2021   USUAL DUCTAL HYPERPLASIA, ADENOSIS AND FIBROCYSTIC CHANGES   CHOLECYSTECTOMY     late 1990s   COLONOSCOPY WITH PROPOFOL  N/A 04/03/2020   Carver: Single tubular adenoma removed.  Next colonoscopy 7 years.   OVARIAN CYST REMOVAL     POLYPECTOMY  04/03/2020   Procedure: POLYPECTOMY;  Surgeon: Cindie Carlin POUR, DO;  Location: [facility];  Service: Endoscopy;;   Current Outpatient Medications on File Prior to Visit  Medication Sig Dispense Refill   amLODipine (NORVASC) 5 MG tablet Take 5 mg by mouth daily.   1   atorvastatin (LIPITOR) 20 MG tablet Take 20 mg by mouth daily.     cetirizine (ZYRTEC) 10 MG tablet Take 10 mg by mouth daily.     cholecalciferol (VITAMIN D3) 25 MCG (1000 UNIT) tablet Take 1,000 Units by mouth daily.      hydrochlorothiazide (HYDRODIURIL) 12.5 MG tablet Take 12.5 mg by mouth daily.   2   pantoprazole (PROTONIX) 20 MG tablet Take 20 mg by mouth daily.     Potassium Chloride ER 20 MEQ TBCR Take 1 tablet by mouth daily.     potassium chloride SA (KLOR-CON) 20 MEQ tablet Take 20 mEq by mouth daily.     sucralfate (CARAFATE) 1 g tablet Take 1 g by mouth as needed.     Black Cohosh 540 MG CAPS Take by mouth daily. (Patient not taking: Reported on 03/23/2024)     No current facility-administered medications on file prior to visit.    Allergies  Allergen Reactions   Codeine Nausea And Vomiting    Abdominal Cramping     PE Today's Vitals   03/23/24 1545  BP: 122/72  Pulse: 82  Temp: 98.2 F (36.8 C)  TempSrc: Oral  SpO2: 99%  Weight: 229 lb (103.9 kg)  Height: 5' 7 (1.702 m)   Body mass index is 35.87 kg/m.  Physical Exam Vitals reviewed. Exam conducted with a chaperone present.  Constitutional:      General: She is not in acute distress.    Appearance: Normal appearance.  HENT:     Head: Normocephalic and atraumatic.  Nose: Nose normal.  Eyes:     Extraocular Movements: Extraocular movements intact.     Conjunctiva/sclera: Conjunctivae normal.  Pulmonary:     Effort: Pulmonary effort is normal.  Genitourinary:    General: Normal vulva.     Exam position: Lithotomy position.     Vagina: Normal. No vaginal discharge.     Cervix: Normal. No cervical motion tenderness, discharge or lesion.     Uterus: Normal. Not enlarged and not tender.      Adnexa: Right adnexa normal and left adnexa normal.  Musculoskeletal:        General: Normal range of motion.     Cervical back: Normal range of motion.  Neurological:     General: No focal deficit present.     Mental Status: She is alert.  Psychiatric:        Mood and Affect: Mood normal.        Behavior: Behavior normal.      03/23/24 TVUS: Indications: Postmenopausal bleeding, history of ovarian cysts  Findings:    Uterus: 10.9 x 8.2 x 7.3 cm, anteverted multifibroid uterus. Endometrial thickness: 12 mm. Fibroids: 1-4.5 x 4.4 cm, intramural 2-3.8 x 2.8 cm, subserosal 3-2.5 x 1 2.0 cm, appears submucosal 4-2.2 x 2.0 cm, subserosal 5-9.3 x 5.8 cm, pedunculated Adnexa: 12.2 x 8.8 x 9.5 cm midline, multiloculated, avascular mass present. Left ovary: Not visualized Right ovary: Not visualized No free fluid.  Impression:  Thickened endometrium in postmenopausal patient. Recommend endometrial biopsy. 12 cm midline, multiloculated, avascular mass present. O-RADS 4, recommend gynecologic oncology referral. Multifibroid uterus.  Largest fibroid measures 9.3 cm and is pedunculated.  Vera LULLA Pa, MD   Procedure Consent was signed. Timeout was performed. Speculum inserted into the vagina, cervix visualized and was prepped with Hibiclens .  Cervical block was declined. A single-toothed tenaculum was placed on the anterior lip of the cervix to stabilize it.  Cervical stenosis was encountered. Cervix was dilated with os finder. The 3 mm pipelle was introduced into the endometrial cavity without difficulty to a depth of 9cm, suction initiated and a moderate amount of tissue was obtained over 2 passes and sent to pathology.  The instruments were removed from the patient's vagina.  Minimal bleeding from the cervix was noted.  The patient tolerated the procedure well.   Assessment and Plan:        PMB (postmenopausal bleeding) Assessment & Plan: Reviewed TVUS with patient revealing thickened endometrium, multiple uterine fibroids (the largest measuring 9cm), and 12cm multilocular adnexal mass.  PMB& thickened endometrium: recommend EMB- consented and performed. Adnexal mass: recommend tumor markers and GYN ONC referral for >10cm multiloculated adnexal mass.  All questions answered  Orders: -     Surgical pathology -     Ambulatory referral to Gynecologic Oncology  Adnexal mass -     Ovarian  Malignancy Risk-ROMA -     Ambulatory referral to Gynecologic Oncology  Intramural, submucous, and subserous leiomyoma of uterus Assessment & Plan: Discussed pathology of fibroids and benign nature. Reviewed that management of fibroids is dependent upon associated symptoms. Asymptomatic fibroids can be managed expectantly. However, fibroids can cause AUB and bulk symptoms, including dysmenorrhea, pelvic and lower back pain and pressure, dyspareunia, and constipation.  Patient is currently asymptomatic. Treatment pending pathology and TM results.   Thickened endometrium Assessment & Plan: Recommended EMB, uncx F/u pathology   Vera LULLA Pa, MD

## 2024-03-24 ENCOUNTER — Ambulatory Visit: Payer: Self-pay | Admitting: Obstetrics and Gynecology

## 2024-03-25 LAB — SURGICAL PATHOLOGY

## 2024-03-27 LAB — OVARIAN MALIGNANCY RISK-ROMA
CA125: 14 U/mL (ref <35)
HE4, OVARIAN CANCER MONITORING: 26 pmol/L
ROMA Postmenopausal: 0.59 (ref <2.77)
ROMA Premenopausal: 0.16 (ref <1.31)

## 2024-03-30 ENCOUNTER — Ambulatory Visit: Payer: Self-pay | Admitting: Obstetrics and Gynecology

## 2024-03-30 DIAGNOSIS — N950 Postmenopausal bleeding: Secondary | ICD-10-CM

## 2024-04-07 ENCOUNTER — Telehealth: Payer: Self-pay

## 2024-04-07 NOTE — Telephone Encounter (Signed)
 Kim from Dr. Lewie office called asking if we could get the patient in for resample before the 16th of December.  Oncology is trying to schedule her for December 19th and needs repeat bx back before appointment. I spoke with Luke and told her I would have to speak to Kate Africa to see if we could work out a solution.

## 2024-04-07 NOTE — Telephone Encounter (Signed)
 Spoke with Carrie Evans regarding her referral to GYN oncology. She has an appointment scheduled with Dr. Viktoria on 04/30/24 at 9:00. Patient agrees to date and time. She has been provided with office address and location. She is also aware of our mask and visitor policy. Patient verbalized understanding and will call with any questions.

## 2024-04-07 NOTE — Telephone Encounter (Signed)
 Patient has been moved to December 10th at 1:30pm with Dr. Dallie for EMB. Patient is aware that the appointment has been moved.

## 2024-04-21 ENCOUNTER — Other Ambulatory Visit (HOSPITAL_COMMUNITY)
Admission: RE | Admit: 2024-04-21 | Discharge: 2024-04-21 | Disposition: A | Source: Ambulatory Visit | Attending: Obstetrics and Gynecology | Admitting: Obstetrics and Gynecology

## 2024-04-21 ENCOUNTER — Encounter: Payer: Self-pay | Admitting: Obstetrics and Gynecology

## 2024-04-21 ENCOUNTER — Ambulatory Visit: Admitting: Obstetrics and Gynecology

## 2024-04-21 VITALS — BP 122/76 | HR 95 | Temp 97.5°F | Wt 229.0 lb

## 2024-04-21 DIAGNOSIS — N950 Postmenopausal bleeding: Secondary | ICD-10-CM | POA: Diagnosis present

## 2024-04-21 DIAGNOSIS — R9389 Abnormal findings on diagnostic imaging of other specified body structures: Secondary | ICD-10-CM | POA: Insufficient documentation

## 2024-04-21 DIAGNOSIS — Z01812 Encounter for preprocedural laboratory examination: Secondary | ICD-10-CM

## 2024-04-21 LAB — PREGNANCY, URINE: Preg Test, Ur: NEGATIVE

## 2024-04-21 NOTE — Patient Instructions (Signed)
 It is common to have vaginal bleeding and cramping for up to 72 hours after your biopsy. Please call our office with heavy vaginal bleeding, severe abdominal pain or fever. Avoid intercourse, tampon use, douching and baths for 7 days to decrease the risk of infection.

## 2024-04-21 NOTE — Progress Notes (Signed)
 58 y.o. [id number] female with PMB, thickened endometrium, adnexal mass, uterine fibroids here for repeat EMB for insufficient sampling. Married. 1 daughter, 3 grandkids. Works at [employer].  PCP: Janace Stephens RAMAN, MD   Patient's last menstrual period was 12/18/2010.   No spotting since last exam Sexually active: Yes  Last PAP:     Component Value Date/Time   DIAGPAP  02/24/2024 1553    - Negative for Intraepithelial Lesions or Malignancy (NILM)   DIAGPAP - Benign reactive/reparative changes 02/24/2024 1553   HPVHIGH Negative 02/24/2024 1553   ADEQPAP  02/24/2024 1553    Satisfactory for evaluation; transformation zone component ABSENT.   Last mammogram: 03/17/23, BIRADS 1, density b, scheduled 04/21/24  Last colonoscopy: 04/03/20 7 yr recall  03/23/24 TVUS: Indications: Postmenopausal bleeding, history of ovarian cysts  Findings:   Uterus: 10.9 x 8.2 x 7.3 cm, anteverted multifibroid uterus. Endometrial thickness: 12 mm. Fibroids: 1-4.5 x 4.4 cm, intramural 2-3.8 x 2.8 cm, subserosal 3-2.5 x 1 2.0 cm, appears submucosal 4-2.2 x 2.0 cm, subserosal 5-9.3 x 5.8 cm, pedunculated Adnexa: 12.2 x 8.8 x 9.5 cm midline, multiloculated, avascular mass present. Left ovary: Not visualized Right ovary: Not visualized No free fluid.  Impression:  Thickened endometrium in postmenopausal patient. Recommend endometrial biopsy. 12 cm midline, multiloculated, avascular mass present. O-RADS 4, recommend gynecologic oncology referral. Multifibroid uterus.  Largest fibroid measures 9.3 cm and is pedunculated.  03/23/24 path: A. ENDOMETRIUM, BIOPSY:  - Blood clot and mucus with scant unremarkable endocervical glandular epithelium.  - Endometrium is not present for evaluation.      GYN HISTORY: Benign ovarian cystectomy  OB History  Gravida Para Term Preterm AB Living  1 1  1  1   SAB IAB Ectopic Multiple Live Births      1    # Outcome Date GA Lbr Len/2nd Weight Sex Type  Anes PTL Lv  1 Preterm     F Vag-Spont   LIV   Past Medical History:  Diagnosis Date   Hypertension    Past Surgical History:  Procedure Laterality Date   BREAST BIOPSY Left 2021   USUAL DUCTAL HYPERPLASIA, ADENOSIS AND FIBROCYSTIC CHANGES   CHOLECYSTECTOMY     late 1990s   COLONOSCOPY WITH PROPOFOL  N/A 04/03/2020   [facility]: Single tubular adenoma removed.  Next colonoscopy 7 years.   OVARIAN CYST REMOVAL     POLYPECTOMY  04/03/2020   Procedure: POLYPECTOMY;  Surgeon: Cindie Carlin POUR, DO;  Location: [facility];  Service: Endoscopy;;   Current Outpatient Medications on File Prior to Visit  Medication Sig Dispense Refill   amLODipine (NORVASC) 5 MG tablet Take 5 mg by mouth daily.   1   atorvastatin (LIPITOR) 20 MG tablet Take 20 mg by mouth daily.     cetirizine (ZYRTEC) 10 MG tablet Take 10 mg by mouth daily.     cholecalciferol (VITAMIN D3) 25 MCG (1000 UNIT) tablet Take 1,000 Units by mouth daily.     hydrochlorothiazide (HYDRODIURIL) 12.5 MG tablet Take 12.5 mg by mouth daily.   2   pantoprazole (PROTONIX) 20 MG tablet Take 20 mg by mouth daily.     Potassium Chloride ER 20 MEQ TBCR Take 1 tablet by mouth daily.     sucralfate (CARAFATE) 1 g tablet Take 1 g by mouth as needed.     No current facility-administered medications on file prior to visit.    Allergies  Allergen Reactions   Codeine Nausea And Vomiting  Abdominal Cramping     PE Today's Vitals   04/21/24 1339  BP: 122/76  Pulse: 95  Temp: (!) 97.5 F (36.4 C)  TempSrc: Oral  SpO2: 98%  Weight: 229 lb (103.9 kg)   Body mass index is 35.87 kg/m.  Physical Exam Vitals reviewed. Exam conducted with a chaperone present.  Constitutional:      General: She is not in acute distress.    Appearance: Normal appearance.  HENT:     Head: Normocephalic and atraumatic.     Nose: Nose normal.  Eyes:     Extraocular Movements: Extraocular movements intact.     Conjunctiva/sclera: Conjunctivae normal.   Pulmonary:     Effort: Pulmonary effort is normal.  Genitourinary:    General: Normal vulva.     Exam position: Lithotomy position.     Vagina: Normal. No vaginal discharge.     Cervix: Normal. No cervical motion tenderness, discharge or lesion.     Adnexa: Right adnexa normal.  Musculoskeletal:        General: Normal range of motion.     Cervical back: Normal range of motion.  Neurological:     General: No focal deficit present.     Mental Status: She is alert.  Psychiatric:        Mood and Affect: Mood normal.        Behavior: Behavior normal.      Procedure Consent was signed. Timeout was performed. Speculum inserted into the vagina, cervix visualized and was prepped with Hibiclens .  Cervical block was declined. A single-toothed tenaculum was placed on the anterior lip of the cervix to stabilize it.  Cervical stenosis was encountered. Cervix was dilated with os finder. The 3 mm pipelle was introduced into the endometrial cavity without difficulty to a depth of 10+cm, suction initiated and a moderate amount of tissue was obtained over 2 passes and sent to pathology.  The instruments were removed from the patient's vagina.  Minimal bleeding from the cervix was noted.  The patient tolerated the procedure well.   Assessment and Plan:        Pre-procedure lab exam -     Pregnancy, urine  Thickened endometrium -     Surgical pathology  PMB (postmenopausal bleeding) -     Surgical pathology  Has appt with Dr. Viktoria, GYN ONC 04/30/24 for management of 12cm adnexal mass, PMB, thickened endometrium  Vera LULLA Pa, MD

## 2024-04-23 LAB — SURGICAL PATHOLOGY

## 2024-04-26 ENCOUNTER — Encounter: Payer: Self-pay | Admitting: Gynecologic Oncology

## 2024-04-26 ENCOUNTER — Ambulatory Visit: Payer: Self-pay | Admitting: Obstetrics and Gynecology

## 2024-04-26 NOTE — Telephone Encounter (Signed)
 Spoke with patient and results were reviewed. I read Dr. Dallie message to her she voiced understanding.

## 2024-04-27 ENCOUNTER — Ambulatory Visit: Admitting: Obstetrics and Gynecology

## 2024-04-29 NOTE — Progress Notes (Unsigned)
 GYNECOLOGIC ONCOLOGY NEW PATIENT CONSULTATION   Patient Name: Carrie Evans  Patient Age: 58 y.o. Date of Service: 04/30/2024 Referring Provider: Dr. Vera Pa  Primary Care Provider: Zhou-Talbert, Stephens RAMAN, MD Consulting Provider: Comer Dollar, MD   Assessment/Plan:  ***   Discussed plan for ovary as well as uterus.  D&C first, sent for frozen.  Discussed sentinel lymph node, hysterectomy.  Possible mini lap.  Discussed staging.  A copy of this note was sent to the patient's referring provider.   *** minutes of total time was spent for this patient encounter, including preparation, face-to-face counseling with the patient and coordination of care, and documentation of the encounter.  Comer Dollar, MD  Division of Gynecologic Oncology  Department of Obstetrics and Gynecology  [facility]  ___________________________________________  Chief Complaint: Chief Complaint  Patient presents with   adnexal mass, endometrial thickening    History of Present Illness:  Carrie Evans is a 58 y.o. y.o. female who is seen in consultation at the request of Dr. Pa for an evaluation of adnexal mass, postmenopausal bleeding/thickened endometrium.  Patient initially presented with postmenopausal bleeding. EMB on 03/23/24: no endometrium, blood clot and mucus with scant unremarkable endocervical glandular epithelium. Pelvic ultrasound: Uterus 11 x 8 x 7 cm with endometrial lining of 12 mm. Multiple fibroids, the largest measuring 9.3 x 5.8 cm. Adnexal mass measures 12.2 x 8.8 x 9.5 cm midline, multiloculated and avascular. ORADS 4. Neither ovary visualized. Tumor markers:  CA-125: 14, HE4: 26, Postmenopausal ROMA: 0.59 Repeat EMB on 12/10: no endometrium, few strips unremarkable endocervical glandular epithelium. Predominantly mucus and blood.  Patient comes in with her husband today.  She reports overall doing well.  She endorses spotting for 2 days in  September, not enough to wear a pad and noted only when she wiped.  Denies any associated pain or cramping.  Has not had any bleeding since.  Has some urinary frequency on blood pressure medications, no recent change to symptoms.  Endorses normal bowel function.  Reports good appetite without nausea or emesis.  Denies any recent weight changes.  Surgical history notable for 2 ovarian cystectomies performed through a mini laparotomy, both benign.  PAST MEDICAL HISTORY:  Past Medical History:  Diagnosis Date   Hypertension    Prediabetes      PAST SURGICAL HISTORY:  Past Surgical History:  Procedure Laterality Date   BREAST BIOPSY Left 2021   USUAL DUCTAL HYPERPLASIA, ADENOSIS AND FIBROCYSTIC CHANGES   CHOLECYSTECTOMY     late 1990s   COLONOSCOPY WITH PROPOFOL  N/A 04/03/2020   [facility]: Single tubular adenoma removed.  Next colonoscopy 7 years.   OVARIAN CYST REMOVAL     x 2 - first at age 5 and second at age 89   POLYPECTOMY  04/03/2020   Procedure: POLYPECTOMY;  Surgeon: Cindie Carlin POUR, DO;  Location: [facility];  Service: Endoscopy;;    OB/GYN HISTORY:  OB History  Gravida Para Term Preterm AB Living  1 1  1  1   SAB IAB Ectopic Multiple Live Births      1    # Outcome Date GA Lbr Len/2nd Weight Sex Type Anes PTL Lv  1 Preterm     F Vag-Spont   LIV    Patient's last menstrual period was 12/18/2010.  Age at menarche: 84  Age at menopause: ***38? Hx of HRT: denies Hx of STDs: denies Last pap: 03/2024 - NILM, HR HPV negative History of abnormal pap  smears: ***  SCREENING STUDIES:  Last mammogram: 2025  Last colonoscopy: 2021  MEDICATIONS: Outpatient Encounter Medications as of 04/30/2024  Medication Sig   amLODipine (NORVASC) 5 MG tablet Take 5 mg by mouth daily.    atorvastatin (LIPITOR) 20 MG tablet Take 20 mg by mouth daily.   cetirizine (ZYRTEC) 10 MG tablet Take 10 mg by mouth daily.   cholecalciferol (VITAMIN D3) 25 MCG (1000 UNIT) tablet Take 1,000  Units by mouth daily.   hydrochlorothiazide (HYDRODIURIL) 25 MG tablet Take 25 mg by mouth daily.   pantoprazole (PROTONIX) 20 MG tablet Take 20 mg by mouth daily.   Potassium Chloride ER 20 MEQ TBCR Take 1 tablet by mouth daily.   sucralfate (CARAFATE) 1 g tablet Take 1 g by mouth as needed.   hydrochlorothiazide (HYDRODIURIL) 12.5 MG tablet Take 12.5 mg by mouth daily.  (Patient not taking: Reported on 04/26/2024)   No facility-administered encounter medications on file as of 04/30/2024.    ALLERGIES:  Allergies[1]   FAMILY HISTORY:  Family History  Problem Relation Age of Onset   Ulcerative colitis Sister    Breast cancer Sister    Breast cancer Cousin        Mother's niece   Colon cancer Neg Hx    Ovarian cancer Neg Hx    Endometrial cancer Neg Hx    Prostate cancer Neg Hx      SOCIAL HISTORY:  Social Connections: Not on file    REVIEW OF SYSTEMS:  Denies appetite changes, fevers, chills, fatigue, unexplained weight changes. Denies hearing loss, neck lumps or masses, mouth sores, ringing in ears or voice changes. Denies cough or wheezing.  Denies shortness of breath. Denies chest pain or palpitations. Denies leg swelling. Denies abdominal distention, pain, blood in stools, constipation, diarrhea, nausea, vomiting, or early satiety. Denies pain with intercourse, dysuria, frequency, hematuria or incontinence. Denies hot flashes, pelvic pain, vaginal bleeding or vaginal discharge.   Denies joint pain, back pain or muscle pain/cramps. Denies itching, rash, or wounds. Denies dizziness, headaches, numbness or seizures. Denies swollen lymph nodes or glands, denies easy bruising or bleeding. Denies anxiety, depression, confusion, or decreased concentration.  Physical Exam:  Vital Signs for this encounter:  Blood pressure 136/68, pulse 67, temperature 98.6 F (37 C), temperature source Oral, resp. rate 19, height 5' 7 (1.702 m), weight 232 lb (105.2 kg), last menstrual  period 12/18/2010, SpO2 100%. Body mass index is 36.34 kg/m. General: Alert, oriented, no acute distress.  HEENT: Normocephalic, atraumatic. Sclera anicteric.  Chest: Clear to auscultation bilaterally. No wheezes, rhonchi, or rales. Cardiovascular: Regular rate and rhythm, no murmurs, rubs, or gallops.  Abdomen: Obese. Normoactive bowel sounds. Soft, nondistended, nontender to palpation. No masses or hepatosplenomegaly appreciated. No palpable fluid wave.  Well-healed midline laparotomy incision. Extremities: Grossly normal range of motion. Warm, well perfused. No edema bilaterally.  Skin: No rashes or lesions.  Lymphatics: No cervical, supraclavicular, or inguinal adenopathy.  GU:  Normal external female genitalia. No lesions. No discharge or bleeding.             Bladder/urethra:  No lesions or masses, well supported bladder             Vagina: No lesions.             Cervix: Normal appearing, no lesions.             Uterus: 10 cm, mobile, no parametrial involvement or nodularity.  Adnexa: Smooth mass fills the cul-de-sac, mobile.  Rectal: Deferred.  LABORATORY AND RADIOLOGIC DATA:  Outside medical records were reviewed to synthesize the above history, along with the history and physical obtained during the visit.   Lab Results  Component Value Date   GLUCOSE 83 03/31/2020   NA 139 03/31/2020   K 3.9 03/31/2020   CL 103 03/31/2020   CREATININE 0.79 03/31/2020   BUN 16 03/31/2020   CO2 28 03/31/2020       [1]  Allergies Allergen Reactions   Codeine Nausea And Vomiting    Abdominal Cramping

## 2024-04-29 NOTE — H&P (View-Only) (Signed)
 GYNECOLOGIC ONCOLOGY NEW PATIENT CONSULTATION   Patient Name: Carrie Evans  Patient Age: 58 y.o. Date of Service: 05/03/2024 Referring Provider: Dr. Vera Pa  Primary Care Provider: Zhou-Talbert, Stephens RAMAN, MD Consulting Provider: Comer Dollar, MD   Assessment/Plan:  Postmenopausal patient with bleeding, complex adnexal mass.  Patient is overall doing well.  She is relatively asymptomatic.  Discussed her workup which shows thickened endometrium although no endometrial tissue seen on 2 endometrial biopsies in clinic.  Also discussed complex 12 cm adnexal mass with some imaging features that raise the possibility for borderline process or malignancy.  Tumor markers were overall reassuring but we reviewed that these are not diagnostic.  Given thickened endometrium and postmenopausal bleeding, plan for inhibin B as well to help rule out a granulosa cell tumor.  Given 2 unsuccessful attempts at endometrial biopsy in clinic, I recommend that we plan for endometrial sampling under anesthesia.  Discussed that this could be done with a D&C at the start of her surgery and that laparoscopic guidance could be used if any difficulty accessing the endometrial cavity was encountered.  Biopsy would then be sent for frozen section.  If endometrial cancer noted, reviewed plan for ICG injection to perform sentinel lymph node biopsy.    Given complex adnexal mass, discussed that this would be placed in an Endo Catch bag, removed, and sent for frozen section.  If benign, no additional procedures would be indicated.  In the setting of a borderline tumor, discussed staging procedures including omentectomy and peritoneal biopsies.  In the setting of an ovarian cancer, lymph node sampling may also be indicated.  If both endometrium and ovarian mass are benign, the patient would like to proceed with robotic total hysterectomy and bilateral salpingo-oophorectomy.  Given the size of her uterus, we reviewed possible  need for mini laparotomy for specimen delivery.  If endometrial cancer is noted on biopsy initially, we reviewed the sentinel lymph node technique. Risks and benefits of sentinel lymph node biopsy was reviewed. We reviewed the technique and ICG dye. The patient DOES NOT have an iodine allergy or known liver dysfunction. We reviewed the false negative rate (0.4%), and that 3% of patients with metastatic disease will not have it detected by SLN biopsy in endometrial cancer. A low risk of allergic reaction to the dye, <0.2% for ICG, has been reported. We also discussed that in the case of failed mapping, which occurs 40% of the time, a bilateral or unilateral lymphadenectomy will be performed at the surgeons discretion.   Potential benefits of sentinel nodes including a higher detection rate for metastasis due to ultrastaging and potential reduction in operative morbidity. However, there remains uncertainty as to the role for treatment of micrometastatic disease. Further, the benefit of operative morbidity associated with the SLN technique in endometrial cancer is not yet completely known. In other patient populations (e.g. the cervical cancer population) there has been observed reductions in morbidity with SLN biopsy compared to pelvic lymphadenectomy. Lymphedema, nerve dysfunction and lymphocysts are all potential risks with the SLN technique as with complete lymphadenectomy. Additional risks to the patient include the risk of damage to an internal organ while operating in an altered view (e.g. the black and white image of the robotic fluorescence imaging mode).   We discussed the plan for a dilation and curettage, robotic assisted hysterectomy, bilateral salpingo-oophorectomy, possible sentinel lymph node evaluation, possible lymph node dissection, possible staging, possible laparotomy. The risks of surgery were discussed in detail and she understands these to  include infection; wound separation; hernia;  vaginal cuff separation, injury to adjacent organs such as bowel, bladder, blood vessels, ureters and nerves; bleeding which may require blood transfusion; anesthesia risk; thromboembolic events; possible death; unforeseen complications; possible need for re-exploration; medical complications such as heart attack, stroke, pleural effusion and pneumonia; and, if full lymphadenectomy is performed the risk of lymphedema and lymphocyst. The patient will receive DVT and antibiotic prophylaxis as indicated. She voiced a clear understanding. She had the opportunity to ask questions. Perioperative instructions were reviewed with her. Prescriptions for post-op medications were sent to her pharmacy of choice.  A copy of this note was sent to the patient's referring provider.   70 minutes of total time was spent for this patient encounter, including preparation, face-to-face counseling with the patient and coordination of care, and documentation of the encounter.  Comer Dollar, MD  Division of Gynecologic Oncology  Department of Obstetrics and Gynecology  [facility]  ___________________________________________  Chief Complaint: Chief Complaint  Patient presents with   adnexal mass, endometrial thickening    History of Present Illness:  Carrie Evans is a 58 y.o. y.o. female who is seen in consultation at the request of Dr. Dallie for an evaluation of adnexal mass, postmenopausal bleeding/thickened endometrium.  Patient initially presented with postmenopausal bleeding. EMB on 03/23/24: no endometrium, blood clot and mucus with scant unremarkable endocervical glandular epithelium. Pelvic ultrasound: Uterus 11 x 8 x 7 cm with endometrial lining of 12 mm. Multiple fibroids, the largest measuring 9.3 x 5.8 cm. Adnexal mass measures 12.2 x 8.8 x 9.5 cm midline, multiloculated and avascular. ORADS 4. Neither ovary visualized. Tumor markers:  CA-125: 14, HE4: 26, Postmenopausal  ROMA: 0.59 Repeat EMB on 12/10: no endometrium, few strips unremarkable endocervical glandular epithelium. Predominantly mucus and blood.  Patient comes in with her husband today.  She reports overall doing well.  She endorses spotting for 2 days in September, not enough to wear a pad and noted only when she wiped.  Denies any associated pain or cramping.  Has not had any bleeding since.  Has some urinary frequency on blood pressure medications, no recent change to symptoms.  Endorses normal bowel function.  Reports good appetite without nausea or emesis.  Denies any recent weight changes.  Surgical history notable for 2 ovarian cystectomies performed through a mini laparotomy, both benign.  PAST MEDICAL HISTORY:  Past Medical History:  Diagnosis Date   Hypertension    Prediabetes      PAST SURGICAL HISTORY:  Past Surgical History:  Procedure Laterality Date   BREAST BIOPSY Left 2021   USUAL DUCTAL HYPERPLASIA, ADENOSIS AND FIBROCYSTIC CHANGES   CHOLECYSTECTOMY     late 1990s   COLONOSCOPY WITH PROPOFOL  N/A 04/03/2020   [facility]: Single tubular adenoma removed.  Next colonoscopy 7 years.   OVARIAN CYST REMOVAL     x 2 - first at age 50 and second at age 42   POLYPECTOMY  04/03/2020   Procedure: POLYPECTOMY;  Surgeon: Cindie Carlin POUR, DO;  Location: [facility];  Service: Endoscopy;;    OB/GYN HISTORY:  OB History  Gravida Para Term Preterm AB Living  1 1  1  1   SAB IAB Ectopic Multiple Live Births      1    # Outcome Date GA Lbr Len/2nd Weight Sex Type Anes PTL Lv  1 Preterm     F Vag-Spont   LIV    Patient's last menstrual period was 12/18/2010.  Age at menarche: 52  Age at menopause: 66 Hx of HRT: denies Hx of STDs: denies Last pap: 03/2024 - NILM, HR HPV negative History of abnormal pap smears: denes  SCREENING STUDIES:  Last mammogram: 2025  Last colonoscopy: 2021  MEDICATIONS: Outpatient Encounter Medications as of 04/30/2024  Medication Sig   amLODipine  (NORVASC) 5 MG tablet Take 5 mg by mouth daily.    atorvastatin (LIPITOR) 20 MG tablet Take 20 mg by mouth daily.   cetirizine (ZYRTEC) 10 MG tablet Take 10 mg by mouth daily.   cholecalciferol (VITAMIN D3) 25 MCG (1000 UNIT) tablet Take 1,000 Units by mouth daily.   hydrochlorothiazide (HYDRODIURIL) 25 MG tablet Take 25 mg by mouth daily.   pantoprazole (PROTONIX) 20 MG tablet Take 20 mg by mouth daily.   Potassium Chloride ER 20 MEQ TBCR Take 1 tablet by mouth daily.   sucralfate (CARAFATE) 1 g tablet Take 1 g by mouth as needed.   hydrochlorothiazide (HYDRODIURIL) 12.5 MG tablet Take 12.5 mg by mouth daily.  (Patient not taking: Reported on 04/26/2024)   No facility-administered encounter medications on file as of 04/30/2024.    ALLERGIES:  Allergies[1]   FAMILY HISTORY:  Family History  Problem Relation Age of Onset   Ulcerative colitis Sister    Breast cancer Sister    Breast cancer Cousin        Mother's niece   Colon cancer Neg Hx    Ovarian cancer Neg Hx    Endometrial cancer Neg Hx    Prostate cancer Neg Hx      SOCIAL HISTORY:  Social Connections: Not on file    REVIEW OF SYSTEMS:  Denies appetite changes, fevers, chills, fatigue, unexplained weight changes. Denies hearing loss, neck lumps or masses, mouth sores, ringing in ears or voice changes. Denies cough or wheezing.  Denies shortness of breath. Denies chest pain or palpitations. Denies leg swelling. Denies abdominal distention, pain, blood in stools, constipation, diarrhea, nausea, vomiting, or early satiety. Denies pain with intercourse, dysuria, frequency, hematuria or incontinence. Denies hot flashes, pelvic pain, vaginal bleeding or vaginal discharge.   Denies joint pain, back pain or muscle pain/cramps. Denies itching, rash, or wounds. Denies dizziness, headaches, numbness or seizures. Denies swollen lymph nodes or glands, denies easy bruising or bleeding. Denies anxiety, depression, confusion, or  decreased concentration.  Physical Exam:  Vital Signs for this encounter:  Blood pressure 136/68, pulse 67, temperature 98.6 F (37 C), temperature source Oral, resp. rate 19, height 5' 7 (1.702 m), weight 232 lb (105.2 kg), last menstrual period 12/18/2010, SpO2 100%. Body mass index is 36.34 kg/m. General: Alert, oriented, no acute distress.  HEENT: Normocephalic, atraumatic. Sclera anicteric.  Chest: Clear to auscultation bilaterally. No wheezes, rhonchi, or rales. Cardiovascular: Regular rate and rhythm, no murmurs, rubs, or gallops.  Abdomen: Obese. Normoactive bowel sounds. Soft, nondistended, nontender to palpation. No masses or hepatosplenomegaly appreciated. No palpable fluid wave.  Well-healed midline laparotomy incision. Extremities: Grossly normal range of motion. Warm, well perfused. No edema bilaterally.  Skin: No rashes or lesions.  Lymphatics: No cervical, supraclavicular, or inguinal adenopathy.  GU:  Normal external female genitalia. No lesions. No discharge or bleeding.             Bladder/urethra:  No lesions or masses, well supported bladder             Vagina: No lesions.             Cervix: Normal appearing, no lesions.  Uterus: 10 cm, mobile, no parametrial involvement or nodularity.             Adnexa: Smooth mass fills the cul-de-sac, mobile.  Rectal: Deferred.  LABORATORY AND RADIOLOGIC DATA:  Outside medical records were reviewed to synthesize the above history, along with the history and physical obtained during the visit.   Lab Results  Component Value Date   GLUCOSE 83 03/31/2020   NA 139 03/31/2020   K 3.9 03/31/2020   CL 103 03/31/2020   CREATININE 0.79 03/31/2020   BUN 16 03/31/2020   CO2 28 03/31/2020       [1]  Allergies Allergen Reactions   Codeine Nausea And Vomiting    Abdominal Cramping

## 2024-04-30 ENCOUNTER — Inpatient Hospital Stay

## 2024-04-30 ENCOUNTER — Telehealth: Payer: Self-pay | Admitting: *Deleted

## 2024-04-30 ENCOUNTER — Encounter: Payer: Self-pay | Admitting: Gynecologic Oncology

## 2024-04-30 ENCOUNTER — Inpatient Hospital Stay: Admitting: Gynecologic Oncology

## 2024-04-30 ENCOUNTER — Inpatient Hospital Stay: Attending: Gynecologic Oncology | Admitting: Gynecologic Oncology

## 2024-04-30 VITALS — BP 136/68 | HR 67 | Temp 98.6°F | Resp 19 | Ht 67.0 in | Wt 232.0 lb

## 2024-04-30 DIAGNOSIS — R9389 Abnormal findings on diagnostic imaging of other specified body structures: Secondary | ICD-10-CM | POA: Insufficient documentation

## 2024-04-30 DIAGNOSIS — E669 Obesity, unspecified: Secondary | ICD-10-CM

## 2024-04-30 DIAGNOSIS — R222 Localized swelling, mass and lump, trunk: Secondary | ICD-10-CM | POA: Diagnosis present

## 2024-04-30 DIAGNOSIS — N950 Postmenopausal bleeding: Secondary | ICD-10-CM | POA: Diagnosis not present

## 2024-04-30 DIAGNOSIS — N9489 Other specified conditions associated with female genital organs and menstrual cycle: Secondary | ICD-10-CM | POA: Diagnosis not present

## 2024-04-30 DIAGNOSIS — Z79899 Other long term (current) drug therapy: Secondary | ICD-10-CM | POA: Insufficient documentation

## 2024-04-30 DIAGNOSIS — I10 Essential (primary) hypertension: Secondary | ICD-10-CM | POA: Diagnosis not present

## 2024-04-30 DIAGNOSIS — Z803 Family history of malignant neoplasm of breast: Secondary | ICD-10-CM | POA: Insufficient documentation

## 2024-04-30 NOTE — Progress Notes (Signed)
 Patient here  for a pre-operative appointment prior to her scheduled surgery on a date to be determined.  She is scheduled for a dilation and curettage, total robotic hysterectomy, bilateral salpingo-oophorectomy, possible sentinel lymph nodes biopsy,possible staging including omentectomy and lymph node dissection, possible mini-laparotomy for specimen removal.   Patient will receive a phone call to schedule her  pre-admission testing appointment at [facility] once her surgery date has been set. The surgery was discussed in detail.  See after visit summary for additional details.    Discussed post-op pain management in detail including the aspects of the enhanced recovery pathway.  Advised her that a new prescription would be sent in for a narcotic pain medication and it is only to be used for after her upcoming surgery.  We discussed the use of tylenol  post-op and to monitor for a maximum of 4,000 mg in a 24 hour period.  Also prescribed sennakot to be used after surgery and to hold if having loose stools.  Discussed bowel regimen in detail.     Discussed the use of SCDs and measures to take at home to prevent DVT including frequent mobility.  Reportable signs and symptoms of DVT discussed. Post-operative instructions discussed and expectations for after surgery. Incisional care discussed as well including reportable signs and symptoms including erythema, drainage, wound separation.     15 minutes spent with the patient.  Verbalizing understanding of material discussed. No needs or concerns voiced at the end of the visit.   Advised patient to call for any needs.  Advised that her post-operative medications had been prescribed and could be picked up at any time.    This appointment is included in the global surgical bundle as pre-operative teaching and has no charge.

## 2024-04-30 NOTE — Telephone Encounter (Signed)
 error

## 2024-04-30 NOTE — Patient Instructions (Addendum)
 Preparing for your Surgery  Plan for surgery with Dr. Comer Dollar at [facility]. You will be scheduled for a dilation and curettage, total robotic hysterectomy, bilateral salpingo-oophorectomy, possible sentinel lymph nodes biopsy, possible staging including omentectomy and lymph node dissection, possible mini-laparotomy for specimen removal.   Pre-operative Testing -You will receive a phone call from presurgical testing at [facility] to arrange for a pre-operative appointment and lab work.  -Bring your insurance card, copy of an advanced directive if applicable, medication list  -At that visit, you will be asked to sign a consent for a possible blood transfusion in case a transfusion becomes necessary during surgery.  The need for a blood transfusion is rare but having consent is a necessary part of your care.     -You should not be taking blood thinners or aspirin at least ten days prior to surgery unless instructed by your surgeon.  -Do not take supplements such as fish oil (omega 3), red yeast rice, turmeric before your surgery. STOP TAKING AT LEAST 10 DAYS BEFORE SURGERY. You want to avoid medications with aspirin in them including headache powders such as BC or Goody's), Excedrin migraine.  -If you are taking a GLP-1 medication/injection such as Ozempic, Mounjaro, [id number], this needs to be held before surgery for at least 7 days before.  Day Before Surgery at Home -You will be asked to take in a light diet the day before surgery. You will be advised you can have clear liquids up until 3 hours before your surgery.    Eat a light diet the day before surgery.  Examples including soups, broths, toast, yogurt, mashed potatoes.  AVOID GAS PRODUCING FOODS AND BEVERAGES. Things to avoid include carbonated beverages (fizzy beverages, sodas), raw fruits and raw vegetables (uncooked), or beans.   If your bowels are filled with gas, your surgeon will have difficulty  visualizing your pelvic organs which increases your surgical risks.  Your role in recovery Your role is to become active as soon as directed by your doctor, while still giving yourself time to heal.  Rest when you feel tired. You will be asked to do the following in order to speed your recovery:  - Cough and breathe deeply. This helps to clear and expand your lungs and can prevent pneumonia after surgery.  - STAY ACTIVE WHEN YOU GET HOME. Do mild physical activity. Walking or moving your legs help your circulation and body functions return to normal. Do not try to get up or walk alone the first time after surgery.   -If you develop swelling on one leg or the other, pain in the back of your leg, redness/warmth in one of your legs, please call the office or go to the Emergency Room to have a doppler to rule out a blood clot. For shortness of breath, chest pain-seek care in the Emergency Room as soon as possible. - Actively manage your pain. Managing your pain lets you move in comfort. We will ask you to rate your pain on a scale of zero to 10. It is your responsibility to tell your doctor or nurse where and how much you hurt so your pain can be treated.  Special Considerations -If you are diabetic, you may be placed on insulin after surgery to have closer control over your blood sugars to promote healing and recovery.  This does not mean that you will be discharged on insulin.  If applicable, your oral antidiabetics will be resumed when you are tolerating  a solid diet.  -Your final pathology results from surgery should be available around one week after surgery and the results will be relayed to you when available.  There will be another provider that assists your GYN Oncologist with surgery.  If you end up staying the night, the next day after your surgery you will either see Dr. Viktoria, Dr. Eldonna, or Dr. Olam Mill.  -FMLA forms can be faxed to [phone] and please allow 5-7 business  days for completion.  Pain Management After Surgery -You will be prescribed your pain medication and bowel regimen medications before surgery so that you can have these available when you are discharged from the hospital. The pain medication is for use ONLY AFTER surgery and a new prescription will not be given.   -Make sure that you have Tylenol  and Ibuprofen IF YOU ARE ABLE TO TAKE THESE MEDICATIONS at home to use on a regular basis after surgery for pain control. We recommend alternating the medications every hour to six hours since they work differently and are processed in the body differently for pain relief.  -Review the attached handout on narcotic use and their risks and side effects.   Bowel Regimen -You will be prescribed Sennakot-S to take nightly to prevent constipation especially if you are taking the narcotic pain medication intermittently.  It is important to prevent constipation and drink adequate amounts of liquids. You can stop taking this medication when you are not taking pain medication and you are back on your normal bowel routine.  Risks of Surgery Risks of surgery are low but include bleeding, infection, damage to surrounding structures, re-operation, blood clots, and very rarely death.   Blood Transfusion Information (For the consent to be signed before surgery)  We will be checking your blood type before surgery so in case of emergencies, we will know what type of blood you would need.                                            WHAT IS A BLOOD TRANSFUSION?  A transfusion is the replacement of blood or some of its parts. Blood is made up of multiple cells which provide different functions. Red blood cells carry oxygen and are used for blood loss replacement. White blood cells fight against infection. Platelets control bleeding. Plasma helps clot blood. Other blood products are available for specialized needs, such as hemophilia or other clotting disorders. BEFORE  THE TRANSFUSION  Who gives blood for transfusions?  You may be able to donate blood to be used at a later date on yourself (autologous donation). Relatives can be asked to donate blood. This is generally not any safer than if you have received blood from a stranger. The same precautions are taken to ensure safety when a relative's blood is donated. Healthy volunteers who are fully evaluated to make sure their blood is safe. This is blood bank blood. Transfusion therapy is the safest it has ever been in the practice of medicine. Before blood is taken from a donor, a complete history is taken to make sure that person has no history of diseases nor engages in risky social behavior (examples are intravenous drug use or sexual activity with multiple partners). The donor's travel history is screened to minimize risk of transmitting infections, such as malaria. The donated blood is tested for signs of infectious diseases, such as HIV  and hepatitis. The blood is then tested to be sure it is compatible with you in order to minimize the chance of a transfusion reaction. If you or a relative donates blood, this is often done in anticipation of surgery and is not appropriate for emergency situations. It takes many days to process the donated blood. RISKS AND COMPLICATIONS Although transfusion therapy is very safe and saves many lives, the main dangers of transfusion include:  Getting an infectious disease. Developing a transfusion reaction. This is an allergic reaction to something in the blood you were given. Every precaution is taken to prevent this. The decision to have a blood transfusion has been considered carefully by your caregiver before blood is given. Blood is not given unless the benefits outweigh the risks.  AFTER SURGERY INSTRUCTIONS  Return to work: 4-6 weeks if applicable  Activity: 1. Be up and out of the bed during the day.  Take a nap if needed.  You may walk up steps but be careful and use  the hand rail.  Stair climbing will tire you more than you think, you may need to stop part way and rest.   2. No lifting or straining for 6 weeks over 10 pounds. No pushing, pulling, straining for 6 weeks.  3. No driving for 4-89 days when the following criteria have been met: Do not drive if you are taking narcotic pain medicine and make sure that your reaction time has returned.   4. You can shower as soon as the next day after surgery. Shower daily.  Use your regular soap and water (not directly on the incision) and pat your incision(s) dry afterwards; don't rub.  No tub baths or submerging your body in water until cleared by your surgeon. If you have the soap that was given to you by pre-surgical testing that was used before surgery, you do not need to use it afterwards because this can irritate your incisions.   5. No sexual activity and nothing in the vagina for 12 weeks.  6. You may experience a small amount of clear drainage from your incisions, which is normal.  If the drainage persists, increases, or changes color please call the office.  7. Do not use creams, lotions, or ointments such as neosporin on your incisions after surgery until advised by your surgeon because they can cause removal of the dermabond glue on your incisions.    8. You may experience vaginal spotting after surgery or when the stitches at the top of the vagina begin to dissolve.  The spotting is normal but if you experience heavy bleeding, call our office.  9. Take Tylenol  or ibuprofen first for pain if you are able to take these medications and only use narcotic pain medication for severe pain not relieved by the Tylenol  or Ibuprofen.  Monitor your Tylenol  intake to a max of 4,000 mg in a 24 hour period. You can alternate these medications after surgery.  Diet: 1. Low sodium Heart Healthy Diet is recommended but you are cleared to resume your normal (before surgery) diet after your procedure.  2. It is safe to  use a laxative, such as Miralax or Colace, if you have difficulty moving your bowels before surgery. You have been prescribed Sennakot-S to take at bedtime every evening after surgery to keep bowel movements regular and to prevent constipation.    Wound Care: 1. Keep clean and dry.  Shower daily.  Reasons to call the Doctor: Fever - Oral temperature greater than  100.4 degrees Fahrenheit Foul-smelling vaginal discharge Difficulty urinating Nausea and vomiting Increased pain at the site of the incision that is unrelieved with pain medicine. Difficulty breathing with or without chest pain New calf pain especially if only on one side Sudden, continuing increased vaginal bleeding with or without clots.   Contacts: For questions or concerns you should contact:  Dr. Comer Dollar at [phone]  Eleanor Epps, NP at [phone]  After Hours: call [phone] and have the GYN Oncologist paged/contacted (after 5 pm or on the weekends). You will speak with an after hours RN and let he or she know you have had surgery.  Messages sent via mychart are for non-urgent matters and are not responded to after hours so for urgent needs, please call the after hours number.

## 2024-05-03 ENCOUNTER — Telehealth: Payer: Self-pay | Admitting: *Deleted

## 2024-05-03 ENCOUNTER — Telehealth: Payer: Self-pay

## 2024-05-03 NOTE — Telephone Encounter (Signed)
 Per Eleanor Epps NP, I reached out to Ms.Heinke in regards to a surgery date.   Pt is aware of surgery date on 1/7 with Dr.Tucker. Pt agrees to that date and is aware she will get a call from [facility] pre-admit testing.

## 2024-05-03 NOTE — Telephone Encounter (Signed)
 Per Dr Viktoria fax surgical optimization form and records to the patient's PCP office Dr Janace [phone])

## 2024-05-04 ENCOUNTER — Ambulatory Visit: Payer: Self-pay | Admitting: Gynecologic Oncology

## 2024-05-04 ENCOUNTER — Other Ambulatory Visit: Payer: Self-pay | Admitting: Gynecologic Oncology

## 2024-05-04 ENCOUNTER — Inpatient Hospital Stay

## 2024-05-04 DIAGNOSIS — N9489 Other specified conditions associated with female genital organs and menstrual cycle: Secondary | ICD-10-CM

## 2024-05-04 DIAGNOSIS — R9389 Abnormal findings on diagnostic imaging of other specified body structures: Secondary | ICD-10-CM

## 2024-05-04 LAB — INHIBIN B: Inhibin B: <7 pg/mL

## 2024-05-09 NOTE — Progress Notes (Signed)
 COVID Vaccine received:  []  No [x]  Yes Date of any COVID positive Test in last 90 days: None  PCP - Vaughn Pouch , MD at [facility] [phone] (Work)  [phone] (Fax)  Cardiologist -  none  Chest x-ray -  EKG -  do at PST  05-11-2024 Stress Test -  ECHO -  Cardiac Cath -  CT Coronary Calcium score:   Pacemaker / ICD device [x]  No []  Yes   Spinal Cord Stimulator:[x]  No []  Yes       History of Sleep Apnea? [x]  No []  Yes   CPAP used?- [x]  No []  Yes    Medication on DOS:  Amlodipine, Cetirizine,  Pantoprazole, Flonase Nasal spray,  Hold DOS: hydrochlorothiazide (HCTZ),   Patient has: []  NO Hx DM   [x]  Pre-DM   []  DM1  []   DM2 Does the patient monitor blood sugar?   []  N/A   [x]  No []  Yes   Blood Thinner / Instructions:  none Aspirin Instructions:  none  Activity level: Able to walk up 2 flights of stairs without becoming significantly short of breath or having chest pain?   [x]    Yes   []  No,  would have:  Patient can perform ADLs without assistance.  [x]   Yes    []  No   Anesthesia review: GERD, HTN, Pre-DM no meds  Patient denies any S&S of respiratory illness or Covid - no shortness of breath, fever, cough or chest pain at PAT appointment.  Patient verbalized understanding and agreement to the Pre-Surgical Instructions that were given to them at this PAT appointment. Patient was also educated of the need to review these PAT instructions again prior to her surgery. I reviewed the appropriate phone numbers to call if they have any and questions or concerns.

## 2024-05-09 NOTE — Patient Instructions (Signed)
 SURGICAL WAITING ROOM VISITATION Patients having surgery or a procedure may have no more than 2 support people in the waiting area - these visitors may rotate in the visitor waiting room.   If the patient needs to stay at the hospital during part of their recovery, the visitor guidelines for inpatient rooms apply.  PRE-OP VISITATION  Pre-op nurse will coordinate an appropriate time for 1 support person to accompany the patient in pre-op.  This support person may not rotate.  This visitor will be contacted when the time is appropriate for the visitor to come back in the pre-op area.  Temporary Visitor Restrictions   Children ages 91 and under will not be able to visit patients in [facility] under most circumstances. Visitation is not restricted outside of hospitals unless noted otherwise in the [facility] and Location Specific Visitation Guidelines at :       [email or web address]  Visitors with respiratory illnesses are discouraged from visiting and should remain at home.  You are not required to quarantine at this time prior to your surgery. However, you must do this: Hand Hygiene often Do NOT share personal items Notify your provider if you are in close contact with someone who has COVID or you develop fever 100.4 or greater, new onset of sneezing, cough, sore throat, shortness of breath or body aches.  If you test positive for Covid or have been in contact with anyone that has tested positive in the last 10 days please notify you surgeon.    Your procedure is scheduled on:  [facility]  05-19-2024  Report to [facility] Main Entrance: [address] entrance where the [employer] is available.   Report to admitting at:  07:15   AM  Call this number if you have any questions or problems the morning of surgery [phone]  FOLLOW ANY ADDITIONAL PRE OP INSTRUCTIONS YOU RECEIVED FROM YOUR SURGEON'S OFFICE!!!  Eat a light diet the day before surgery.  Examples including soups,  broths, toast, yogurt, mashed potatoes.  AVOID GAS PRODUCING FOODS. Things to avoid include carbonated beverages (fizzy beverages, sodas), raw fruits and raw vegetables (uncooked), or beans.    Do not eat food after Midnight the night prior to your surgery/procedure.  After Midnight you may have the following liquids until 06:30 AM DAY OF SURGERY  Clear Liquid Diet Water Black Coffee (sugar ok, NO MILK/CREAM OR CREAMERS)  Tea (sugar ok, NO MILK/CREAM OR CREAMERS) regular and decaf                             Plain Jell-O  with no fruit (NO RED)                                           Fruit ices (not with fruit pulp, NO RED)                                     Popsicles (NO RED)  Juice: NO CITRUS JUICES: only apple, WHITE grape, WHITE cranberry Sports drinks like Gatorade or Powerade (NO RED)                Oral Hygiene is also important to reduce your risk of infection.        Remember - BRUSH YOUR TEETH THE MORNING OF SURGERY WITH YOUR REGULAR TOOTHPASTE  Do NOT smoke after Midnight the night before surgery.  STOP TAKING all Vitamins, Herbs and supplements 1 week before your surgery.   Take ONLY these medicines the morning of surgery with A SIP OF WATER: Amlodipine, Cetirizine,  Pantoprazole, and you may use Flonase Nasal spray,  DO NOT TAKE hydrochlorothiazide (HCTZ), the morning of your surgery.                     You may not have any metal on your body including hair pins, jewelry, and body piercing  Do not wear make-up, lotions, powders, perfumes or deodorant  Do not wear nail polish including gel and S&S, artificial / acrylic nails, or any other type of covering on natural nails including finger and toenails. If you have artificial nails, gel coating, etc., that needs to be removed by a nail salon, Please have this removed prior to surgery. Not doing so may mean that your surgery could be cancelled or delayed if  the Surgeon or anesthesia staff feels like they are unable to monitor you safely.   Do not shave 48 hours prior to surgery to avoid nicks in your skin which may contribute to postoperative infections.   Contacts, Hearing Aids, dentures or bridgework may not be worn into surgery. DENTURES WILL BE REMOVED PRIOR TO SURGERY PLEASE DO NOT APPLY Poly grip OR ADHESIVES!!!  Patients discharged on the day of surgery will not be allowed to drive home.  Someone NEEDS to stay with you for the first 24 hours after anesthesia.  Do not bring your home medications to the hospital. The Pharmacy will dispense medications listed on your medication list to you during your admission in the Hospital.  Please read over the following fact sheets you were given: IF YOU HAVE QUESTIONS ABOUT YOUR PRE-OP INSTRUCTIONS, PLEASE CALL [phone]   [facility] - Preparing for Surgery           Before surgery, you can play an important role.  Because skin is not sterile, your skin needs to be as free of germs as possible.  You can reduce the number of germs on your skin by washing with CHG (chlorahexidine gluconate) soap before surgery.  CHG is an antiseptic cleaner which kills germs and bonds with the skin to continue killing germs even after washing. Please DO NOT use if you have an allergy to CHG or antibacterial soaps.  If your skin becomes reddened/irritated stop using the CHG and inform your nurse when you arrive at Short Stay. Do not shave (including legs and underarms) for at least 48 hours prior to the first CHG shower.  You may shave your face/neck.  Please follow these instructions carefully:  1.  Shower with CHG Soap the night before surgery ONLY (DO NOT USE THE CHG SOAP THE MORNING OF SURGERY).  2.  If you choose to wash your hair, wash your hair first as usual with your normal  shampoo.  3.  After you shampoo, rinse your hair and body thoroughly to remove the shampoo.  4.  Use CHG  as you would any other liquid soap.  You can apply chg directly to the skin and wash.  Gently with a scrungie or clean washcloth.  5.  Apply the CHG Soap to your body ONLY FROM THE NECK DOWN.   Do not use on face/ open                           Wound or open sores. Avoid contact with eyes, ears mouth and genitals (private parts).                       Wash face,  Genitals (private parts) with your normal soap.             6.  Wash thoroughly, paying special attention to the area where your  surgery  will be performed.  7.  Thoroughly rinse your body with warm water from the neck down.  8.  DO NOT shower/wash with your normal soap after using and rinsing off the CHG Soap.                9.  Pat yourself dry with a clean towel.            10.  Wear clean pajamas.            11.  Place clean sheets on your bed the night of your first shower and do not  sleep with pets.  Day of Surgery : Do not apply any CHG, lotions/deodorants the morning of surgery.  Please wear clean clothes to the hospital/surgery center.   FAILURE TO FOLLOW THESE INSTRUCTIONS MAY RESULT IN THE CANCELLATION OF YOUR SURGERY  PATIENT SIGNATURE_________________________________  NURSE SIGNATURE__________________________________  ________________________________________________________________________      WHAT IS A BLOOD TRANSFUSION? Blood Transfusion Information  A transfusion is the replacement of blood or some of its parts. Blood is made up of multiple cells which provide different functions. Red blood cells carry oxygen and are used for blood loss replacement. White blood cells fight against infection. Platelets control bleeding. Plasma helps clot blood. Other blood products are available for specialized needs, such as hemophilia or other clotting disorders. BEFORE THE TRANSFUSION  Who gives blood for transfusions?  Healthy volunteers who are fully evaluated to make sure their blood is safe. This is blood bank  blood. Transfusion therapy is the safest it has ever been in the practice of medicine. Before blood is taken from a donor, a complete history is taken to make sure that person has no history of diseases nor engages in risky social behavior (examples are intravenous drug use or sexual activity with multiple partners). The donor's travel history is screened to minimize risk of transmitting infections, such as malaria. The donated blood is tested for signs of infectious diseases, such as HIV and hepatitis. The blood is then tested to be sure it is compatible with you in order to minimize the chance of a transfusion reaction. If you or a relative donates blood, this is often done in anticipation of surgery and is not appropriate for emergency situations. It takes many days to process the donated blood. RISKS AND COMPLICATIONS Although transfusion therapy is very safe and saves many lives, the main dangers of transfusion include:  Getting an infectious disease. Developing a transfusion reaction. This is an allergic reaction to something in the blood you were given. Every precaution is taken to prevent this. The decision  to have a blood transfusion has been considered carefully by your caregiver before blood is given. Blood is not given unless the benefits outweigh the risks. AFTER THE TRANSFUSION Right after receiving a blood transfusion, you will usually feel much better and more energetic. This is especially true if your red blood cells have gotten low (anemic). The transfusion raises the level of the red blood cells which carry oxygen, and this usually causes an energy increase. The nurse administering the transfusion will monitor you carefully for complications. HOME CARE INSTRUCTIONS  No special instructions are needed after a transfusion. You may find your energy is better. Speak with your caregiver about any limitations on activity for underlying diseases you may have. SEEK MEDICAL CARE IF:  Your  condition is not improving after your transfusion. You develop redness or irritation at the intravenous (IV) site. SEEK IMMEDIATE MEDICAL CARE IF:  Any of the following symptoms occur over the next 12 hours: Shaking chills. You have a temperature by mouth above 102 F (38.9 C), not controlled by medicine. Chest, back, or muscle pain. People around you feel you are not acting correctly or are confused. Shortness of breath or difficulty breathing. Dizziness and fainting. You get a rash or develop hives. You have a decrease in urine output. Your urine turns a dark color or changes to pink, red, or brown. Any of the following symptoms occur over the next 10 days: You have a temperature by mouth above 102 F (38.9 C), not controlled by medicine. Shortness of breath. Weakness after normal activity. The white part of the eye turns yellow (jaundice). You have a decrease in the amount of urine or are urinating less often. Your urine turns a dark color or changes to pink, red, or brown. Document Released: 04/26/2000 Document Revised: 07/22/2011 Document Reviewed: 12/14/2007 [facility] Patient Information 2014 [place], [place].  _______________________________________________________________________

## 2024-05-11 ENCOUNTER — Encounter (HOSPITAL_COMMUNITY): Payer: Self-pay

## 2024-05-11 ENCOUNTER — Encounter (HOSPITAL_COMMUNITY)
Admission: RE | Admit: 2024-05-11 | Discharge: 2024-05-11 | Disposition: A | Discharge status: Home / Self Care | Source: Ambulatory Visit | Attending: Gynecologic Oncology | Admitting: Gynecologic Oncology

## 2024-05-11 ENCOUNTER — Other Ambulatory Visit: Payer: Self-pay

## 2024-05-11 VITALS — BP 146/77 | HR 78 | Temp 98.2°F | Resp 20 | Ht 67.0 in | Wt 226.0 lb

## 2024-05-11 DIAGNOSIS — I10 Essential (primary) hypertension: Secondary | ICD-10-CM | POA: Diagnosis not present

## 2024-05-11 DIAGNOSIS — N950 Postmenopausal bleeding: Secondary | ICD-10-CM | POA: Insufficient documentation

## 2024-05-11 DIAGNOSIS — N9489 Other specified conditions associated with female genital organs and menstrual cycle: Secondary | ICD-10-CM | POA: Diagnosis not present

## 2024-05-11 DIAGNOSIS — R7303 Prediabetes: Secondary | ICD-10-CM | POA: Insufficient documentation

## 2024-05-11 DIAGNOSIS — Z01818 Encounter for other preprocedural examination: Secondary | ICD-10-CM | POA: Diagnosis present

## 2024-05-11 HISTORY — DX: Gastro-esophageal reflux disease without esophagitis: K21.9

## 2024-05-11 HISTORY — DX: Family history of other specified conditions: Z84.89

## 2024-05-11 HISTORY — DX: Prediabetes: R73.03

## 2024-05-11 LAB — COMPREHENSIVE METABOLIC PANEL WITH GFR
ALT: 12 U/L (ref 0–44)
AST: 18 U/L (ref 15–41)
Albumin: 4.3 g/dL (ref 3.5–5.0)
Alkaline Phosphatase: 91 U/L (ref 38–126)
Anion gap: 11 (ref 5–15)
BUN: 11 mg/dL (ref 6–20)
CO2: 27 mmol/L (ref 22–32)
Calcium: 9.7 mg/dL (ref 8.9–10.3)
Chloride: 102 mmol/L (ref 98–111)
Creatinine, Ser: 0.87 mg/dL (ref 0.44–1.00)
GFR, Estimated: >60 mL/min
Glucose, Bld: 105 mg/dL — ABNORMAL HIGH (ref 70–99)
Potassium: 4.1 mmol/L (ref 3.5–5.1)
Sodium: 140 mmol/L (ref 135–145)
Total Bilirubin: 0.6 mg/dL (ref 0.0–1.2)
Total Protein: 8.1 g/dL (ref 6.5–8.1)

## 2024-05-11 LAB — CBC
HCT: 43.1 % (ref 36.0–46.0)
Hemoglobin: 14.1 g/dL (ref 12.0–15.0)
MCH: 27.8 pg (ref 26.0–34.0)
MCHC: 32.7 g/dL (ref 30.0–36.0)
MCV: 85 fL (ref 80.0–100.0)
Platelets: 261 K/uL (ref 150–400)
RBC: 5.07 MIL/uL (ref 3.87–5.11)
RDW: 13.4 % (ref 11.5–15.5)
WBC: 5 K/uL (ref 4.0–10.5)
nRBC: 0 % (ref 0.0–0.2)

## 2024-05-12 ENCOUNTER — Telehealth: Payer: Self-pay

## 2024-05-12 NOTE — Telephone Encounter (Signed)
 FMLA forms received from [facility] for upcoming surgery on 1/7 with Dr.Tucker. Requested information provided and faxed.   Pt is aware

## 2024-05-12 NOTE — Telephone Encounter (Signed)
 Pt called into the office wanting to know if her forms were received in our office. I let her know that we hadn't received her forms, but she did stated that she had reached out to Dr. Shelly office and they will be retrieving the forms.

## 2024-05-17 NOTE — Telephone Encounter (Signed)
 LMOM at Dr Janace office regarding the clearance form

## 2024-05-18 ENCOUNTER — Other Ambulatory Visit: Payer: Self-pay | Admitting: Gynecologic Oncology

## 2024-05-18 ENCOUNTER — Telehealth: Payer: Self-pay | Admitting: *Deleted

## 2024-05-18 DIAGNOSIS — N9489 Other specified conditions associated with female genital organs and menstrual cycle: Secondary | ICD-10-CM

## 2024-05-18 MED ORDER — TRAMADOL HCL 50 MG PO TABS: 50.0000 mg | ORAL_TABLET | Freq: Four times a day (QID) | ORAL | 0 refills | Status: DC | PRN

## 2024-05-18 MED ORDER — SENNOSIDES-DOCUSATE SODIUM 8.6-50 MG PO TABS: 2.0000 | ORAL_TABLET | Freq: Every day | ORAL | 0 refills | Status: AC

## 2024-05-18 NOTE — Telephone Encounter (Signed)
Attempted to reach patient for pre-op call. Left voicemail requesting call back to [phone].

## 2024-05-18 NOTE — Telephone Encounter (Signed)
 2nd attempt to reach patient for pre-op call. Left voicemail requesting call back to [phone].

## 2024-05-18 NOTE — Telephone Encounter (Signed)
Telephone call to check on pre-operative status.  Patient compliant with pre-operative instructions.  Reinforced nothing to eat after midnight. Clear liquids until 0800. Patient to arrive at 0900.  No questions or concerns voiced.  Instructed to call for any needs. 

## 2024-05-19 ENCOUNTER — Encounter: Payer: Self-pay | Admitting: Gynecologic Oncology

## 2024-05-19 ENCOUNTER — Ambulatory Visit (HOSPITAL_COMMUNITY): Payer: Self-pay

## 2024-05-19 ENCOUNTER — Encounter (HOSPITAL_COMMUNITY): Payer: Self-pay

## 2024-05-19 ENCOUNTER — Ambulatory Visit (HOSPITAL_COMMUNITY)
Admission: RE | Admit: 2024-05-19 | Discharge: 2024-05-19 | Disposition: A | Discharge status: Home / Self Care | Source: Ambulatory Visit | Attending: Gynecologic Oncology | Admitting: Gynecologic Oncology

## 2024-05-19 ENCOUNTER — Encounter (HOSPITAL_COMMUNITY): Payer: Self-pay | Admitting: Gynecologic Oncology

## 2024-05-19 ENCOUNTER — Encounter (HOSPITAL_COMMUNITY): Admission: RE | Disposition: A | Payer: Self-pay | Source: Ambulatory Visit | Attending: Gynecologic Oncology

## 2024-05-19 ENCOUNTER — Other Ambulatory Visit: Payer: Self-pay

## 2024-05-19 DIAGNOSIS — N9489 Other specified conditions associated with female genital organs and menstrual cycle: Secondary | ICD-10-CM

## 2024-05-19 DIAGNOSIS — N72 Inflammatory disease of cervix uteri: Secondary | ICD-10-CM | POA: Diagnosis not present

## 2024-05-19 DIAGNOSIS — N950 Postmenopausal bleeding: Secondary | ICD-10-CM | POA: Insufficient documentation

## 2024-05-19 DIAGNOSIS — N736 Female pelvic peritoneal adhesions (postinfective): Secondary | ICD-10-CM | POA: Diagnosis not present

## 2024-05-19 DIAGNOSIS — K219 Gastro-esophageal reflux disease without esophagitis: Secondary | ICD-10-CM | POA: Insufficient documentation

## 2024-05-19 DIAGNOSIS — D270 Benign neoplasm of right ovary: Secondary | ICD-10-CM | POA: Diagnosis not present

## 2024-05-19 DIAGNOSIS — R9389 Abnormal findings on diagnostic imaging of other specified body structures: Secondary | ICD-10-CM | POA: Diagnosis not present

## 2024-05-19 DIAGNOSIS — E66813 Obesity, class 3: Secondary | ICD-10-CM | POA: Insufficient documentation

## 2024-05-19 DIAGNOSIS — N838 Other noninflammatory disorders of ovary, fallopian tube and broad ligament: Secondary | ICD-10-CM | POA: Insufficient documentation

## 2024-05-19 DIAGNOSIS — N888 Other specified noninflammatory disorders of cervix uteri: Secondary | ICD-10-CM | POA: Diagnosis not present

## 2024-05-19 DIAGNOSIS — I10 Essential (primary) hypertension: Secondary | ICD-10-CM | POA: Diagnosis not present

## 2024-05-19 DIAGNOSIS — D259 Leiomyoma of uterus, unspecified: Secondary | ICD-10-CM | POA: Diagnosis not present

## 2024-05-19 DIAGNOSIS — Z79899 Other long term (current) drug therapy: Secondary | ICD-10-CM | POA: Diagnosis not present

## 2024-05-19 DIAGNOSIS — N840 Polyp of corpus uteri: Secondary | ICD-10-CM | POA: Diagnosis not present

## 2024-05-19 DIAGNOSIS — Z6835 Body mass index (BMI) 35.0-35.9, adult: Secondary | ICD-10-CM | POA: Diagnosis not present

## 2024-05-19 DIAGNOSIS — K660 Peritoneal adhesions (postprocedural) (postinfection): Secondary | ICD-10-CM

## 2024-05-19 HISTORY — PX: DILATION AND CURETTAGE OF UTERUS: SHX78

## 2024-05-19 HISTORY — PX: LYMPH NODE BIOPSY: SHX201

## 2024-05-19 HISTORY — PX: INJECTION, FOR SENTINEL LYMPH NODE IDENTIFICATION: SHX7598

## 2024-05-19 HISTORY — PX: ROBOTIC ASSISTED TOTAL HYSTERECTOMY WITH BILATERAL SALPINGO OOPHERECTOMY: SHX6086

## 2024-05-19 LAB — TYPE AND SCREEN
ABO/RH(D): A POS
Antibody Screen: NEGATIVE

## 2024-05-19 LAB — ABO/RH: ABO/RH(D): A POS

## 2024-05-19 MED ORDER — BUPIVACAINE HCL 0.25 % IJ SOLN
INTRAMUSCULAR | Status: DC | PRN
  Administered 2024-05-19: 30 mL

## 2024-05-19 MED ORDER — FENTANYL CITRATE (PF) 250 MCG/5ML IJ SOLN
INTRAMUSCULAR | Status: AC
  Filled 2024-05-19: qty 5

## 2024-05-19 MED ORDER — LIDOCAINE HCL (PF) 2 % IJ SOLN
INTRAMUSCULAR | Status: AC
  Filled 2024-05-19: qty 5

## 2024-05-19 MED ORDER — STERILE WATER FOR INJECTION IJ SOLN
INTRAMUSCULAR | Status: AC
  Filled 2024-05-19: qty 10

## 2024-05-19 MED ORDER — ROCURONIUM BROMIDE 10 MG/ML (PF) SYRINGE
PREFILLED_SYRINGE | INTRAVENOUS | Status: AC
  Filled 2024-05-19: qty 10

## 2024-05-19 MED ORDER — KETAMINE HCL 50 MG/5ML IJ SOSY
PREFILLED_SYRINGE | INTRAMUSCULAR | Status: AC
  Filled 2024-05-19: qty 5

## 2024-05-19 MED ORDER — PHENYLEPHRINE HCL-NACL 20-0.9 MG/250ML-% IV SOLN
INTRAVENOUS | Status: DC | PRN
  Administered 2024-05-19: 100 ug via INTRAVENOUS
  Administered 2024-05-19: 30 ug/min via INTRAVENOUS
  Administered 2024-05-19: 100 ug via INTRAVENOUS

## 2024-05-19 MED ORDER — MIDAZOLAM HCL 5 MG/5ML IJ SOLN
INTRAMUSCULAR | Status: DC | PRN
  Administered 2024-05-19: 2 mg via INTRAVENOUS

## 2024-05-19 MED ORDER — STERILE WATER FOR INJECTION IJ SOLN
INTRAMUSCULAR | Status: DC | PRN
  Administered 2024-05-19: 4 mL

## 2024-05-19 MED ORDER — BUPIVACAINE HCL (PF) 0.25 % IJ SOLN
INTRAMUSCULAR | Status: AC
  Filled 2024-05-19: qty 30

## 2024-05-19 MED ORDER — LIDOCAINE HCL 2 % IJ SOLN
INTRAMUSCULAR | Status: AC
  Filled 2024-05-19: qty 20

## 2024-05-19 MED ORDER — LIDOCAINE HCL (CARDIAC) PF 100 MG/5ML IV SOSY
PREFILLED_SYRINGE | INTRAVENOUS | Status: DC | PRN
  Administered 2024-05-19: 60 mg via INTRAVENOUS

## 2024-05-19 MED ORDER — LACTATED RINGERS IV SOLN: INTRAVENOUS | Status: DC

## 2024-05-19 MED ORDER — CEFAZOLIN SODIUM-DEXTROSE 2-4 GM/100ML-% IV SOLN
2.0000 g | INTRAVENOUS | Status: AC
  Administered 2024-05-19: 2 g via INTRAVENOUS
  Filled 2024-05-19: qty 100

## 2024-05-19 MED ORDER — ONDANSETRON HCL 4 MG/2ML IJ SOLN
INTRAMUSCULAR | Status: DC | PRN
  Administered 2024-05-19: 4 mg via INTRAVENOUS

## 2024-05-19 MED ORDER — MIDAZOLAM HCL 2 MG/2ML IJ SOLN
INTRAMUSCULAR | Status: AC
  Filled 2024-05-19: qty 2

## 2024-05-19 MED ORDER — LIDOCAINE HCL (PF) 2 % IJ SOLN
INTRAMUSCULAR | Status: DC | PRN
  Administered 2024-05-19: 1.5 mg/kg/h via INTRADERMAL

## 2024-05-19 MED ORDER — BUPIVACAINE LIPOSOME 1.3 % IJ SUSP
INTRAMUSCULAR | Status: DC | PRN
  Administered 2024-05-19: 20 mL

## 2024-05-19 MED ORDER — LACTATED RINGERS IR SOLN
Status: DC | PRN
  Administered 2024-05-19: 1000 mL

## 2024-05-19 MED ORDER — SODIUM CHLORIDE (PF) 0.9 % IJ SOLN
INTRAMUSCULAR | Status: DC | PRN
  Administered 2024-05-19: 20 mL

## 2024-05-19 MED ORDER — FENTANYL CITRATE (PF) 100 MCG/2ML IJ SOLN
INTRAMUSCULAR | Status: DC | PRN
  Administered 2024-05-19: 50 ug via INTRAVENOUS
  Administered 2024-05-19: 25 ug via INTRAVENOUS
  Administered 2024-05-19 (×3): 50 ug via INTRAVENOUS
  Administered 2024-05-19: 25 ug via INTRAVENOUS

## 2024-05-19 MED ORDER — PROPOFOL 10 MG/ML IV BOLUS
INTRAVENOUS | Status: DC | PRN
  Administered 2024-05-19: 170 mg via INTRAVENOUS

## 2024-05-19 MED ORDER — SUGAMMADEX SODIUM 200 MG/2ML IV SOLN
INTRAVENOUS | Status: DC | PRN
  Administered 2024-05-19: 200 mg via INTRAVENOUS

## 2024-05-19 MED ORDER — ROCURONIUM BROMIDE 100 MG/10ML IV SOLN
INTRAVENOUS | Status: DC | PRN
  Administered 2024-05-19 (×2): 20 mg via INTRAVENOUS
  Administered 2024-05-19: 60 mg via INTRAVENOUS
  Administered 2024-05-19: 10 mg via INTRAVENOUS

## 2024-05-19 MED ORDER — ORAL CARE MOUTH RINSE: 15.0000 mL | Freq: Once | OROMUCOSAL | Status: AC

## 2024-05-19 MED ORDER — DEXAMETHASONE SOD PHOSPHATE PF 10 MG/ML IJ SOLN
INTRAMUSCULAR | Status: AC
  Filled 2024-05-19: qty 1

## 2024-05-19 MED ORDER — HEPARIN SODIUM (PORCINE) 5000 UNIT/ML IJ SOLN
5000.0000 [IU] | INTRAMUSCULAR | Status: AC
  Administered 2024-05-19: 5000 [IU] via SUBCUTANEOUS
  Filled 2024-05-19: qty 1

## 2024-05-19 MED ORDER — BUPIVACAINE LIPOSOME 1.3 % IJ SUSP
INTRAMUSCULAR | Status: AC
  Filled 2024-05-19: qty 20

## 2024-05-19 MED ORDER — SILVER NITRATE-POT NITRATE 75-25 % EX MISC
CUTANEOUS | Status: AC
  Filled 2024-05-19: qty 10

## 2024-05-19 MED ORDER — KETAMINE HCL 50 MG/5ML IJ SOSY
PREFILLED_SYRINGE | INTRAMUSCULAR | Status: DC | PRN
  Administered 2024-05-19: 30 mg via INTRAVENOUS
  Administered 2024-05-19: 10 mg via INTRAVENOUS

## 2024-05-19 MED ORDER — METRONIDAZOLE 500 MG/100ML IV SOLN
500.0000 mg | INTRAVENOUS | Status: AC
  Administered 2024-05-19: 500 mg via INTRAVENOUS
  Filled 2024-05-19: qty 100

## 2024-05-19 MED ORDER — ONDANSETRON HCL 4 MG/2ML IJ SOLN
INTRAMUSCULAR | Status: AC
  Filled 2024-05-19: qty 2

## 2024-05-19 MED ORDER — STERILE WATER FOR IRRIGATION IR SOLN
Status: DC | PRN
  Administered 2024-05-19: 1000 mL

## 2024-05-19 MED ORDER — GABAPENTIN 300 MG PO CAPS
300.0000 mg | ORAL_CAPSULE | ORAL | Status: AC
  Administered 2024-05-19: 300 mg via ORAL
  Filled 2024-05-19: qty 1

## 2024-05-19 MED ORDER — ACETAMINOPHEN 500 MG PO TABS
1000.0000 mg | ORAL_TABLET | ORAL | Status: AC
  Administered 2024-05-19: 1000 mg via ORAL
  Filled 2024-05-19: qty 2

## 2024-05-19 MED ORDER — DEXAMETHASONE SOD PHOSPHATE PF 10 MG/ML IJ SOLN
4.0000 mg | INTRAMUSCULAR | Status: AC
  Administered 2024-05-19: 5 mg via INTRAVENOUS

## 2024-05-19 MED ORDER — HYDROMORPHONE HCL 1 MG/ML IJ SOLN
0.2500 mg | INTRAMUSCULAR | Status: DC | PRN
  Administered 2024-05-19 (×2): 0.5 mg via INTRAVENOUS

## 2024-05-19 MED ORDER — SODIUM CHLORIDE (PF) 0.9 % IJ SOLN
INTRAMUSCULAR | Status: AC
  Filled 2024-05-19: qty 20

## 2024-05-19 MED ORDER — PROPOFOL 10 MG/ML IV BOLUS
INTRAVENOUS | Status: AC
  Filled 2024-05-19: qty 20

## 2024-05-19 MED ORDER — STERILE WATER FOR INJECTION IJ SOLN
INTRAMUSCULAR | Status: DC | PRN
  Administered 2024-05-19: 10 mL

## 2024-05-19 MED ORDER — SCOPOLAMINE 1 MG/3DAYS TD PT72
1.0000 | MEDICATED_PATCH | TRANSDERMAL | Status: DC
  Administered 2024-05-19: 1 mg via TRANSDERMAL
  Filled 2024-05-19: qty 1

## 2024-05-19 MED ORDER — SUGAMMADEX SODIUM 200 MG/2ML IV SOLN
INTRAVENOUS | Status: AC
  Filled 2024-05-19: qty 2

## 2024-05-19 MED ORDER — CHLORHEXIDINE GLUCONATE 0.12 % MT SOLN
15.0000 mL | Freq: Once | OROMUCOSAL | Status: AC
  Administered 2024-05-19: 15 mL via OROMUCOSAL

## 2024-05-19 MED ORDER — HYDROMORPHONE HCL 1 MG/ML IJ SOLN
INTRAMUSCULAR | Status: AC
  Filled 2024-05-19: qty 1

## 2024-05-19 MED ORDER — LIDOCAINE HCL (PF) 1 % IJ SOLN
INTRAMUSCULAR | Status: AC
  Filled 2024-05-19: qty 30

## 2024-05-19 NOTE — Discharge Instructions (Addendum)
 AFTER SURGERY INSTRUCTIONS   Return to work: 4-6 weeks if applicable   Activity: 1. Be up and out of the bed during the day.  Take a nap if needed.  You may walk up steps but be careful and use the hand rail.  Stair climbing will tire you more than you think, you may need to stop part way and rest.    2. No lifting or straining for 6 weeks over 10 pounds. No pushing, pulling, straining for 6 weeks.   3. No driving for 1-61 days when the following criteria have been met: Do not drive if you are taking narcotic pain medicine and make sure that your reaction time has returned.    4. You can shower as soon as the next day after surgery. Shower daily.  Use your regular soap and water  (not directly on the incision) and pat your incision(s) dry afterwards; don't rub.  No tub baths or submerging your body in water  until cleared by your surgeon. If you have the soap that was given to you by pre-surgical testing that was used before surgery, you do not need to use it afterwards because this can irritate your incisions.    5. No sexual activity and nothing in the vagina for 12 weeks.   6. You may experience a small amount of clear drainage from your incisions, which is normal.  If the drainage persists, increases, or changes color please call the office.   7. Do not use creams, lotions, or ointments such as neosporin on your incisions after surgery until advised by your surgeon because they can cause removal of the dermabond glue on your incisions.     8. You may experience vaginal spotting after surgery or when the stitches at the top of the vagina begin to dissolve.  The spotting is normal but if you experience heavy bleeding, call our office.   9. Take Tylenol or ibuprofen first for pain if you are able to take these medications and only use narcotic pain medication for severe pain not relieved by the Tylenol or Ibuprofen.  Monitor your Tylenol intake to a max of 4,000 mg in a 24 hour period. You can  alternate these medications after surgery.   Diet: 1. Low sodium Heart Healthy Diet is recommended but you are cleared to resume your normal (before surgery) diet after your procedure.   2. It is safe to use a laxative, such as Miralax or Colace, if you have difficulty moving your bowels before surgery. You have been prescribed Sennakot-S to take at bedtime every evening after surgery to keep bowel movements regular and to prevent constipation.     Wound Care: 1. Keep clean and dry.  Shower daily.   Reasons to call the Doctor: Fever - Oral temperature greater than 100.4 degrees Fahrenheit Foul-smelling vaginal discharge Difficulty urinating Nausea and vomiting Increased pain at the site of the incision that is unrelieved with pain medicine. Difficulty breathing with or without chest pain New calf pain especially if only on one side Sudden, continuing increased vaginal bleeding with or without clots.   Contacts: For questions or concerns you should contact:   Dr. Wiley Hanger at [phone]   Carrie Grieves, NP at [phone]   After Hours: call [phone] and have the GYN Oncologist paged/contacted (after 5 pm or on the weekends). You will speak with an after hours RN and let he or she know you have had surgery.   Messages sent via mychart are for non-urgent  matters and are not responded to after hours so for urgent needs, please call the after hours number.

## 2024-05-19 NOTE — Anesthesia Postprocedure Evaluation (Signed)
"   Anesthesia Post Note  Patient: Carrie Evans  Procedure(s) Performed: ENDOMETRIAL BIOPSY HYSTERECTOMY, TOTAL, ROBOT-ASSISTED, LAPAROSCOPIC, WITH BILATERAL SALPINGO-OOPHORECTOMY (Abdomen) INJECTION, FOR SENTINEL LYMPH NODE IDENTIFICATION LYMPH NODE BIOPSY LYMPHADENECTOMY, PELVIS, ROBOT-ASSISTED     Patient location during evaluation: PACU Anesthesia Type: General Level of consciousness: awake and alert Pain management: pain level controlled Vital Signs Assessment: post-procedure vital signs reviewed and stable Respiratory status: spontaneous breathing, nonlabored ventilation, respiratory function stable and patient connected to nasal cannula oxygen Cardiovascular status: blood pressure returned to baseline and stable Postop Assessment: no apparent nausea or vomiting Anesthetic complications: no   No notable events documented.  Last Vitals:  Vitals:   05/19/24 1630 05/19/24 1645  BP: (!) 142/86 138/75  Pulse: 86   Resp: 20 18  Temp: (!) 36.4 C   SpO2: (!) 88% 100%    Last Pain:  Vitals:   05/19/24 1645  TempSrc:   PainSc: 5                  Dillinger Aston,W. EDMOND      "

## 2024-05-19 NOTE — Transfer of Care (Signed)
 Immediate Anesthesia Transfer of Care Note  Patient: Carrie Evans  Procedure(s) Performed: ENDOMETRIAL BIOPSY HYSTERECTOMY, TOTAL, ROBOT-ASSISTED, LAPAROSCOPIC, WITH BILATERAL SALPINGO-OOPHORECTOMY (Abdomen) INJECTION, FOR SENTINEL LYMPH NODE IDENTIFICATION LYMPH NODE BIOPSY LYMPHADENECTOMY, PELVIS, ROBOT-ASSISTED  Patient Location: PACU  Anesthesia Type:General  Level of Consciousness: awake  Airway & Oxygen Therapy: Patient Spontanous Breathing and Patient connected to nasal cannula oxygen  Post-op Assessment: Report given to RN and Post -op Vital signs reviewed and stable  Post vital signs: Reviewed and stable  Last Vitals:  Vitals Value Taken Time  BP 144/79 05/19/24 15:30  Temp    Pulse 70 05/19/24 15:33  Resp 9 05/19/24 15:33  SpO2 99 % 05/19/24 15:33  Vitals shown include unfiled device data.  Last Pain:  Vitals:   05/19/24 1001  TempSrc: Oral  PainSc:          Complications: No notable events documented.

## 2024-05-19 NOTE — Anesthesia Procedure Notes (Signed)
 Procedure Name: Intubation Date/Time: 05/19/2024 11:45 AM  Performed by: Belvie Valri NOVAK, CRNAPre-anesthesia Checklist: Patient identified, Emergency Drugs available, Suction available and Patient being monitored Patient Re-evaluated:Patient Re-evaluated prior to induction Oxygen Delivery Method: Circle System Utilized Preoxygenation: Pre-oxygenation with 100% oxygen Induction Type: IV induction Ventilation: Mask ventilation without difficulty and Oral airway inserted - appropriate to patient size Laryngoscope Size: Mac and 3 Grade View: Grade III Tube type: Oral Number of attempts: 1 Airway Equipment and Method: Stylet and Oral airway Placement Confirmation: ETT inserted through vocal cords under direct vision, positive ETCO2 and breath sounds checked- equal and bilateral Secured at: 22 cm Tube secured with: Tape Dental Injury: Teeth and Oropharynx as per pre-operative assessment

## 2024-05-19 NOTE — Anesthesia Preprocedure Evaluation (Addendum)
"                                    Anesthesia Evaluation  Patient identified by MRN, date of birth, ID band Patient awake    Reviewed: Allergy & Precautions, H&P , NPO status , Patient's Chart, lab work & pertinent test results  History of Anesthesia Complications (+) Family history of anesthesia reaction  Airway Mallampati: II  TM Distance: >3 FB Neck ROM: Full    Dental no notable dental hx. (+) Teeth Intact, Dental Advisory Given   Pulmonary neg pulmonary ROS   Pulmonary exam normal breath sounds clear to auscultation       Cardiovascular hypertension, Pt. on medications  Rhythm:Regular Rate:Normal     Neuro/Psych negative neurological ROS  negative psych ROS   GI/Hepatic Neg liver ROS,GERD  Medicated,,  Endo/Other    Class 3 obesity  Renal/GU negative Renal ROS  negative genitourinary   Musculoskeletal   Abdominal   Peds  Hematology negative hematology ROS (+)   Anesthesia Other Findings   Reproductive/Obstetrics negative OB ROS                              Anesthesia Physical Anesthesia Plan  ASA: 3  Anesthesia Plan: General   Post-op Pain Management: Tylenol  PO (pre-op)*, Ketamine  IV* and Lidocaine  infusion*   Induction: Intravenous  PONV Risk Score and Plan: 4 or greater and Dexamethasone , Midazolam  and Ondansetron   Airway Management Planned: Oral ETT  Additional Equipment:   Intra-op Plan:   Post-operative Plan: Extubation in OR  Informed Consent: I have reviewed the patients History and Physical, chart, labs and discussed the procedure including the risks, benefits and alternatives for the proposed anesthesia with the patient or authorized representative who has indicated his/her understanding and acceptance.     Dental advisory given  Plan Discussed with: CRNA  Anesthesia Plan Comments:          Anesthesia Quick Evaluation  "

## 2024-05-19 NOTE — Interval H&P Note (Signed)
 History and Physical Interval Note:  05/19/2024 9:24 AM  Caiya CHRISTELLA Como  has presented today for surgery, with the diagnosis of ADNEXAL MASS, POST MENOPAUSAL BLEEDING.  The various methods of treatment have been discussed with the patient and family. After consideration of risks, benefits and other options for treatment, the patient has consented to  Procedures with comments: DILATION AND CURETTAGE (N/A) HYSTERECTOMY, TOTAL, ROBOT-ASSISTED, LAPAROSCOPIC, WITH BILATERAL SALPINGO-OOPHORECTOMY (N/A) - POSSIBLE LAPAROTOMY, POSSIBLE STAGING INJECTION, FOR SENTINEL LYMPH NODE IDENTIFICATION (N/A) LYMPH NODE BIOPSY (N/A) LYMPHADENECTOMY, PELVIS, ROBOT-ASSISTED (N/A) as a surgical intervention.  The patient's history has been reviewed, patient examined, no change in status, stable for surgery.  I have reviewed the patient's chart and labs.  Questions were answered to the patient's satisfaction.     Comer JONELLE Dollar

## 2024-05-19 NOTE — Op Note (Signed)
 OPERATIVE NOTE  Pre-operative Diagnosis: Thickened endometrium with inability to sample pre-op, adnexal mass  Post-operative Diagnosis: same, abdominal/pelvic adhesions  Operation: Robotic-assisted laparoscopic total hysterectomy with bilateral salpingo-oophorectomy, laparoscopic and robotic lysis of adhesions  Surgeon: Viktoria Crank MD  Assistant Surgeon: Eleanor Epps, NP (an NP assistant was necessary for tissue manipulation, management of robotic instrumentation, retraction and positioning due to the complexity of the case and hospital policies).   Anesthesia: GET  Urine Output: 150 cc  Operative Findings: ON EUA, enlarged somewhat mobile uterus with fullness in the cul de sac. ON intra-abdominal entry, adhesions of the omentum to the anterior abdominal wall with two loops of small bowel adherent below the level of the umbilicus. Normal upper abdominal survey otherwise. Normal appearing small and large bowel. Uterus 10-12 cm with pedunculated 6 cm fibroid at the left fundus and 9-10 cm fibroid at the right fundus. 8 cm mostly cystic mass replacing the right ovary, quite adherent to the right pelvic sidewall, partially retroperitonealized. No ascites.  On frozen section, benign endometrial polyp. Benign appearing, smooth cystic mass of the right ovary.  Estimated Blood Loss:  100 cc     Total IV Fluids: see I&O flowsheet         Specimens: uterus, cervix, bilateral tubes and ovaries, pelvic washings         Complications:  None apparent; patient tolerated the procedure well.         Disposition: PACU - hemodynamically stable.  Procedure Details  The patient was seen in the Holding Room. The risks, benefits, complications, treatment options, and expected outcomes were discussed with the patient.  The patient concurred with the proposed plan, giving informed consent.  The site of surgery properly noted/marked. The patient was identified as Carrie Evans and the procedure verified as a  Robotic-assisted hysterectomy with bilateral salpingo oophorectomy.   After induction of anesthesia, the patient was draped and prepped in the usual sterile manner. Patient was placed in supine position after anesthesia and draped and prepped in the usual sterile manner as follows: Her arms were tucked to her side with all appropriate precautions.  The patient was secured to the bed using padding and tape across her chest.  The patient was placed in the semi-lithotomy position in Blanchard stirrups.  The perineum and vagina were prepped with CHG. The patient's abdomen was prepped with ChloraPrep and then she was draped after the prep had been allowed to dry for 3 minutes.  A Time Out was held and the above information confirmed.  The urethra was prepped with Betadine. Foley catheter was placed.  A sterile speculum was placed in the vagina.  The cervix was grasped with a single-tooth tenaculum. The cervix was dilated with Fredirick dilators.  The Advincula uterine manipulator with a 3.5 colpotomizer ring was placed without difficulty.  A pneumo-occluder balloon was placed over the manipulator.  OG tube placement was confirmed and to suction.   Next, a 10 mm skin incision was made 1 cm below the subcostal margin in the midclavicular line.  The 5 mm Optiview port and scope was used for direct entry.  Opening pressure was under 10 mm CO2.  The abdomen was insufflated and the findings were noted as above.   At this point and all points during the procedure, the patient's intra-abdominal pressure did not exceed 15 mmHg. Next, an 8 mm skin incision was made superior to the umbilicus and a right and left port were placed about 8 cm lateral to the  robot port on the right and left side.  A fourth arm was placed on the right.  The 5 mm assist trocar was exchanged for a 12 mm airseal port. All ports were placed under direct visualization.  The patient was placed in steep Trendelenburg. Sharp dissection using laparoscopic scissors  was performed to lyse adhesions of the omentum, mostly filmy, to the anterior abdominal wall just inferior to the supraumbilical trocar.  Short bursts of monopolar electrocautery using the scissors were delivered to control small bleeding vessels.  The robot was then docked in normal manner.  The remainder of the omental and small bowel adhesions were then lysed sharply and with short bursts of monopolar electrocautery where necessary, although not done in proximity to the small bowel.  Bowel was folded away into the upper abdomen.    The left peritoneum was opened parallel to the IP ligament to open the retroperitoneal space. The round ligament was transected. The ureter was noted to be on the medial leaf of the broad ligament.  The peritoneum above the ureter was incised and stretched and the infundibulopelvic ligament was skeletonized, cauterized and cut.    The posterior peritoneum was taken down to the level of the KOH ring. The left ovary was adherent to the medial leaf of the broad ligament, which required identification of the left ureter and dissection of the ovary free from the peritoneum.   The anterior peritoneum was also taken down.  The bladder flap was created to the level of the KOH ring.    Attention was then turned to the right. The right peritoneum was opened parallel to the IP ligament to open the retroperitoneal space. The round ligament was transected. The ureter was noted to be on the medial leaf of the broad ligament.  The peritoneum above the ureter was incised and stretched and the infundibulopelvic ligament was skeletonized, cauterized and cut.  Given adherence of the right adnexal mass to the right pelvic side wall, decision made to proceed with cauterization and transection of the utero-ovarian ligament and fallopian tube. Once this had been done, the posterior peritoneum was taken down along the KOH ring on the right (medial to the adnexal mass).   Attention was then  turned anteriorly. The uterine artery on the right side was skeletonized, cauterized and cut in the normal manner.  A similar procedure was performed on the left.  The colpotomy was made and the uterus, cervix, left tube and ovary were amputated and placed in an Endocatch bag inserted through the 12 mm assist trocar.  Pedicles were inspected and excellent hemostasis was achieved.    Attention was then turned back to the right.  With upward traction on the right adnexa, filmy adhesions of the mass to the right pelvic sidewall were lysed.  Retroperitoneum was then further opened and the ureter was identified and followed along its course to below the level of the mass.  Using short burst of monopolar electrocautery and blunt dissection, the ureter was moved laterally from the mass.  The medial leaf of the broad ligament was then cauterized to aid in dissecting the mass free from the right pelvic sidewall.  Ultimately the mass was freed circumferentially and placed in Endo Catch bag inserted through the assist trocar.   Robotic instruments were removed and the robot undocked. The supraumbilical trocar was removed and the incision extended 8-10 cm with a scalpel. The incision was carried down to and through the fascia, with the abdomen insufflated, using monopolar  electrocautery. The peritoneal incision was extended under direct visualization. The endocatch bags with the uterine specimen and right adnexa were delivered through the incision. The right adnexa was drained in contained fashion in the Endocatch bag to facilitate delivery. The uterine specimen required morcellation of several fibroids (within the Endocatch bag) to facilitate delivery. Both specimens were sent for frozen section.   The laparoscopic cap was placed on the Alexis retractor and the abdomen reinsufflated. The patient was placed back in steep Trendelenburg.   The colpotomy at the vaginal cuff was closed with 0 Vicryl using a figure of eight  stitch at each apex and 0 V-Loc to close the midportion of the cuff in two layers in a running manner.  Irrigation was used and excellent hemostasis was achieved. Intra-abdominal pressure was decreased to 5 mm Hg with excellent hemostasis maintained.   At this point in the procedure was completed.  Robotic instruments were removed under direct visulaization.  The robot was undocked. The fascia at the 10-12 mm port was closed with 0 Vicryl with a PMI fascial closure device under direct visualization.  The mini-lap incision was then closed with running #1 looped PDS tied in the midline. The subcutaneous tissue was irrigated and hemostasis achieved. Exparel  was injected for local anesthesia. The subcutaneous tissue was closed with 2-0 Vicyrl in running fashion.   The subcuticular tissue of all incisions was closed with 4-0 Vicryl and the skin was closed with 4-0 Monocryl in a subcuticular manner.  Dermabond was applied.   The vagina was swabbed with minimal bleeding noted. Foley catheter was removed.  All sponge, lap and needle counts were correct x  3.   The patient was transferred to the recovery room in stable condition.  Comer Dollar, MD

## 2024-05-20 ENCOUNTER — Encounter (HOSPITAL_COMMUNITY): Payer: Self-pay | Admitting: Gynecologic Oncology

## 2024-05-20 ENCOUNTER — Telehealth: Payer: Self-pay | Admitting: *Deleted

## 2024-05-20 ENCOUNTER — Other Ambulatory Visit: Payer: Self-pay | Admitting: Gynecologic Oncology

## 2024-05-20 DIAGNOSIS — N950 Postmenopausal bleeding: Secondary | ICD-10-CM

## 2024-05-20 DIAGNOSIS — R9389 Abnormal findings on diagnostic imaging of other specified body structures: Secondary | ICD-10-CM

## 2024-05-20 DIAGNOSIS — N9489 Other specified conditions associated with female genital organs and menstrual cycle: Secondary | ICD-10-CM

## 2024-05-20 MED ORDER — IBUPROFEN 800 MG PO TABS: 800.0000 mg | ORAL_TABLET | Freq: Three times a day (TID) | ORAL | 0 refills | Status: AC | PRN

## 2024-05-20 NOTE — Telephone Encounter (Signed)
 Spoke with Carrie Evans this early afternoon. She states she is eating, drinking and urinating well. She has not had a BM yet but is passing gas. She is taking senokot as prescribed and encouraged her to drink plenty of water . She denies fever or chills. Incisions are dry and intact. She rates her pain 4/10. Her pain is controlled with ibuprofen . Pt states she took 1 tramadol  early this morning, but prefers the ibuprofen  and thanks the office for sending that in.     Instructed to call office with any fever, chills, purulent drainage, uncontrolled pain or any other questions or concerns. Patient verbalizes understanding.   Pt aware of post op appointments as well as the office number [phone] and after hours number [phone] to call if she has any questions or concerns

## 2024-05-21 ENCOUNTER — Ambulatory Visit: Payer: Self-pay | Admitting: Gynecologic Oncology

## 2024-05-21 LAB — CYTOLOGY - NON PAP

## 2024-05-21 LAB — SURGICAL PATHOLOGY

## 2024-05-24 ENCOUNTER — Telehealth: Payer: Self-pay | Admitting: *Deleted

## 2024-05-24 NOTE — Telephone Encounter (Signed)
 Spoke with Carrie Evans who states she is doing well. She is experiencing some lower back pain but only when she gets up in the morning. Advised patient to try using a heating pad, and make sure to splint/support abdominal incisions. Pt is still taking an occasional  pain medicine only once daily. Pt verbalized understanding and thanked the office for calling. Pt is aware of her pathology report and is ok to cancel her phone visit with Dr. Viktoria on 1/14. Pt reminded to call with any concerns and is aware of her post op appt. On 2/6 at 0845.

## 2024-05-24 NOTE — Telephone Encounter (Signed)
-----   Message from Eleanor Epps, NP sent at 05/24/2024  8:45 AM EST ----- This patient is for a post-op call today. Dr. Viktoria said if she is doing well, please let her know we will plan to cancel her post-op phone call later this week since her path was benign. This appt is typically to go over path and Dr. Viktoria said not needed since path benign and she released the results to her for review.  If she is agreeable, please cancel post-op phone call. Thanks.

## 2024-05-26 ENCOUNTER — Inpatient Hospital Stay: Admitting: Gynecologic Oncology

## 2024-06-18 ENCOUNTER — Inpatient Hospital Stay: Attending: Gynecologic Oncology | Admitting: Gynecologic Oncology

## 2024-06-18 ENCOUNTER — Inpatient Hospital Stay: Admitting: Gynecologic Oncology

## 2024-06-18 ENCOUNTER — Encounter: Payer: Self-pay | Admitting: Gynecologic Oncology

## 2024-06-18 VITALS — BP 127/73 | HR 81 | Temp 98.5°F | Resp 19 | Wt 232.0 lb

## 2024-06-18 DIAGNOSIS — N9489 Other specified conditions associated with female genital organs and menstrual cycle: Secondary | ICD-10-CM

## 2024-06-18 DIAGNOSIS — R9389 Abnormal findings on diagnostic imaging of other specified body structures: Secondary | ICD-10-CM

## 2024-06-18 NOTE — Progress Notes (Signed)
 Gynecologic Oncology Return Clinic Visit  06/18/24  Reason for Visit: follow-up  Treatment History: Patient initially presented with postmenopausal bleeding. EMB on 03/23/24: no endometrium, blood clot and mucus with scant unremarkable endocervical glandular epithelium. Pelvic ultrasound: Uterus 11 x 8 x 7 cm with endometrial lining of 12 mm. Multiple fibroids, the largest measuring 9.3 x 5.8 cm. Adnexal mass measures 12.2 x 8.8 x 9.5 cm midline, multiloculated and avascular. ORADS 4. Neither ovary visualized. Tumor markers:             CA-125: 14, HE4: 26, Postmenopausal ROMA: 0.59 Repeat EMB on 12/10: no endometrium, few strips unremarkable endocervical glandular epithelium. Predominantly mucus and blood.  05/19/24: Robotic-assisted laparoscopic total hysterectomy with bilateral salpingo-oophorectomy, laparoscopic and robotic lysis of adhesions   Interval History: Doing well.  Denies any vaginal bleeding.  Denies significant abdominal or pelvic pain.  Endorses normal bowel function.  Still having some hot flashes, although not as bad as they were prior to surgery while at work.  Past Medical/Surgical History: Past Medical History:  Diagnosis Date   Family history of adverse reaction to anesthesia    Sister has PONV   GERD (gastroesophageal reflux disease)    Hypertension    Pre-diabetes    Prediabetes     Past Surgical History:  Procedure Laterality Date   BREAST BIOPSY Left 2021   USUAL DUCTAL HYPERPLASIA, ADENOSIS AND FIBROCYSTIC CHANGES   CHOLECYSTECTOMY  1998   Laparoscopic   COLONOSCOPY WITH PROPOFOL  N/A 04/03/2020   [facility]: Single tubular adenoma removed.  Next colonoscopy 7 years.   DILATION AND CURETTAGE OF UTERUS N/A 05/19/2024   Procedure: ENDOMETRIAL BIOPSY;  Surgeon: Viktoria Comer SAUNDERS, MD;  Location: [facility];  Service: Gynecology;  Laterality: N/A;   INJECTION, FOR SENTINEL LYMPH NODE IDENTIFICATION N/A 05/19/2024   Procedure: INJECTION, FOR SENTINEL LYMPH NODE  IDENTIFICATION;  Surgeon: Viktoria Comer SAUNDERS, MD;  Location: [facility];  Service: Gynecology;  Laterality: N/A;   LYMPH NODE BIOPSY N/A 05/19/2024   Procedure: LYMPH NODE BIOPSY;  Surgeon: Viktoria Comer SAUNDERS, MD;  Location: [facility];  Service: Gynecology;  Laterality: N/A;   OVARIAN CYST REMOVAL     x 2 - first at age 49 and second at age 76   POLYPECTOMY  04/03/2020   Procedure: POLYPECTOMY;  Surgeon: Cindie Carlin POUR, DO;  Location: [facility];  Service: Endoscopy;;   ROBOTIC ASSISTED TOTAL HYSTERECTOMY WITH BILATERAL SALPINGO OOPHERECTOMY N/A 05/19/2024   Procedure: HYSTERECTOMY, TOTAL, ROBOT-ASSISTED, LAPAROSCOPIC, WITH BILATERAL SALPINGO-OOPHORECTOMY;  Surgeon: Viktoria Comer SAUNDERS, MD;  Location: [facility];  Service: Gynecology;  Laterality: N/A;  MINI LAPAROTOMY, UTERUS GREATER THAN 250 GRAMS    Family History  Problem Relation Age of Onset   Ulcerative colitis Sister    Breast cancer Sister    Breast cancer Cousin        Mother's niece   Colon cancer Neg Hx    Ovarian cancer Neg Hx    Endometrial cancer Neg Hx    Prostate cancer Neg Hx     Social History   Socioeconomic History   Marital status: Married    Spouse name: Not on file   Number of children: Not on file   Years of education: Not on file   Highest education level: Not on file  Occupational History   Not on file  Tobacco Use   Smoking status: Never   Smokeless tobacco: Never  Vaping Use   Vaping status: Never Used  Substance and Sexual Activity  Alcohol use: No   Drug use: No   Sexual activity: Yes    Birth control/protection: None  Other Topics Concern   Not on file  Social History Narrative   Not on file   Social Drivers of Health   Tobacco Use: Low Risk (06/18/2024)   Patient History    Smoking Tobacco Use: Never    Smokeless Tobacco Use: Never    Passive Exposure: Not on file  Financial Resource Strain: Not on file  Food Insecurity: Not on file  Transportation Needs: Not on file  Physical  Activity: Not on file  Stress: Not on file  Social Connections: Not on file  Depression (PHQ2-9): Low Risk (02/24/2024)   Depression (PHQ2-9)    PHQ-2 Score: 0  Alcohol Screen: Not on file  Housing: Not on file  Utilities: Not on file  Health Literacy: Not on file    Current Medications: Current Medications[1]  Review of Systems: Denies appetite changes, fevers, chills, fatigue, unexplained weight changes. Denies hearing loss, neck lumps or masses, mouth sores, ringing in ears or voice changes. Denies cough or wheezing.  Denies shortness of breath. Denies chest pain or palpitations. Denies leg swelling. Denies abdominal distention, pain, blood in stools, constipation, diarrhea, nausea, vomiting, or early satiety. Denies pain with intercourse, dysuria, frequency, hematuria or incontinence. Denies hot flashes, pelvic pain, vaginal bleeding or vaginal discharge.   Denies joint pain, back pain or muscle pain/cramps. Denies itching, rash, or wounds. Denies dizziness, headaches, numbness or seizures. Denies swollen lymph nodes or glands, denies easy bruising or bleeding. Denies anxiety, depression, confusion, or decreased concentration.  Physical Exam: BP 127/73 (BP Location: Left Arm, Patient Position: Sitting)   Pulse 81   Temp 98.5 F (36.9 C) (Oral)   Resp 19   Wt 232 lb (105.2 kg)   LMP 12/18/2010   SpO2 100%   BMI 36.34 kg/m  General: Alert, oriented, no acute distress. HEENT: Posterior oropharynx clear, sclera anicteric. Chest: Unlabored breathing on room air. Abdomen: soft, nontender.  Normoactive bowel sounds.  No masses or hepatosplenomegaly appreciated.  Well-healed incisions. Extremities: Grossly normal range of motion.  Warm, well perfused.  No edema bilaterally. GU: Normal appearing external genitalia without erythema, excoriation, or lesions.  Speculum exam reveals cuff intact, suture visible.  Bimanual exam reveals cuff intact, no fluctuance or tenderness to  palpation.    Laboratory & Radiologic Studies: A. ENDOMETRIAL BIOPSY: Abundant blood and scanty inactive endometrial glands. Negative for endometrial intraepithelial neoplasia (EIN) and malignancy.  B. RIGHT TUBE AND OVARY: Benign serous cystadenoma, focally calcified. Negative for endometriosis, borderline change and malignancy. Unremarkable fallopian tube.  C. UTERUS, CERVIX, LEFT TUBE AND OVARY: Cervix     Nabothian cyst and slight cervicitis.     Negative for dysplasia. Endometrium     Inactive endometrium.     Benign endometrial polyp.     Negative for endometrial intraepithelial neoplasia (EIN) and malignancy. Myometrium     Leiomyomata with degenerative changes and focal calcifications.     Negative for malignancy. Left ovary     Unremarkable.     Negative for endometriosis and malignancy. Left fallopian tube     Benign paratubal cyst.     Negative for endometriosis and malignancy.   Assessment & Plan: Carrie Evans is a 59 y.o. woman with thickened endometrium and postmenopausal bleeding, inability to sample preoperatively, adnexal mass now status post robotic total hysterectomy with BSO.  Final pathology benign.  Patient is doing well postoperatively.  Discussed continued  expectations and restrictions.  Reviewed pathology with her from surgery.  She was given a copy of pathology report.  In terms of her hot flashes, they seem to be better since her surgery as she is recovering at home.  Discussed with her that after she goes back to work, if hot flashes become more bothersome again or she is interested in starting treatment, to please reach out by phone or MyChart message.  18 minutes of total time was spent for this patient encounter, including preparation, face-to-face counseling with the patient and coordination of care, and documentation of the encounter.  Comer Dollar, MD  Division of Gynecologic Oncology  Department of Obstetrics and Gynecology  [facility]      [1]  Current Outpatient Medications:    amLODipine (NORVASC) 10 MG tablet, Take 10 mg by mouth daily., Disp: , Rfl: 1   atorvastatin (LIPITOR) 20 MG tablet, Take 20 mg by mouth every evening., Disp: , Rfl:    cetirizine (ZYRTEC) 10 MG tablet, Take 10 mg by mouth daily., Disp: , Rfl:    cholecalciferol (VITAMIN D3) 25 MCG (1000 UNIT) tablet, Take 2,000 Units by mouth daily., Disp: , Rfl:    fluticasone (FLONASE) 50 MCG/ACT nasal spray, Place 1 spray into both nostrils daily as needed for rhinitis., Disp: , Rfl:    hydrochlorothiazide (HYDRODIURIL) 25 MG tablet, Take 25 mg by mouth daily., Disp: , Rfl:    ibuprofen  (ADVIL ) 800 MG tablet, Take 1 tablet (800 mg total) by mouth every 8 (eight) hours as needed for moderate pain (pain score 4-6)., Disp: 30 tablet, Rfl: 0   OVER THE COUNTER MEDICATION, Apply 1 application  topically daily as needed (Foot). Hempvana Ultra strength arthritis cream, Disp: , Rfl:    pantoprazole (PROTONIX) 20 MG tablet, Take 20 mg by mouth daily., Disp: , Rfl:    Potassium Chloride ER 20 MEQ TBCR, Take 20 mEq by mouth daily., Disp: , Rfl:    senna-docusate (SENOKOT-S) 8.6-50 MG tablet, Take 2 tablets by mouth at bedtime. For AFTER surgery, do not take if having diarrhea, Disp: 30 tablet, Rfl: 0   sucralfate (CARAFATE) 1 g tablet, Take 1 g by mouth daily as needed (Upset stomach)., Disp: , Rfl:

## 2024-06-18 NOTE — Patient Instructions (Signed)
 It was good to see you today.  You are healing very well from surgery!  Please remember, nothing in the vagina for 12 weeks.  You can start doing more from a physical activity standpoint but keep listening to your body.  If something hurts, you probably are not ready to do it yet.  As you go back to work, or if you are increasingly bothered by your hot flashes and want to start something for treatment, please send me a MyChart message or call the office.
